# Patient Record
Sex: Female | Born: 1935 | Race: Black or African American | Hispanic: No | Smoking: Former smoker
Health system: Southern US, Community
[De-identification: ages and names within clinical notes are randomized; demographics above are authoritative.]

## PROBLEM LIST (undated history)

## (undated) DIAGNOSIS — R1310 Dysphagia, unspecified: Secondary | ICD-10-CM

## (undated) DIAGNOSIS — R221 Localized swelling, mass and lump, neck: Secondary | ICD-10-CM

## (undated) DIAGNOSIS — D649 Anemia, unspecified: Secondary | ICD-10-CM

## (undated) DIAGNOSIS — I639 Cerebral infarction, unspecified: Secondary | ICD-10-CM

## (undated) DIAGNOSIS — E079 Disorder of thyroid, unspecified: Secondary | ICD-10-CM

## (undated) DIAGNOSIS — E46 Unspecified protein-calorie malnutrition: Secondary | ICD-10-CM

## (undated) DIAGNOSIS — I10 Essential (primary) hypertension: Secondary | ICD-10-CM

## (undated) HISTORY — PX: TONSILLECTOMY: SUR1361

## (undated) HISTORY — PX: APPENDECTOMY: SHX54

## (undated) HISTORY — PX: OTHER SURGICAL HISTORY: SHX169

## (undated) HISTORY — PX: EYE SURGERY: SHX253

---

## 2000-07-14 ENCOUNTER — Encounter: Payer: Self-pay | Admitting: Emergency Medicine

## 2000-07-14 ENCOUNTER — Emergency Department (HOSPITAL_COMMUNITY): Admission: EM | Admit: 2000-07-14 | Discharge: 2000-07-14 | Payer: Self-pay | Admitting: Internal Medicine

## 2000-07-14 ENCOUNTER — Encounter: Payer: Self-pay | Admitting: Internal Medicine

## 2000-09-01 ENCOUNTER — Inpatient Hospital Stay (HOSPITAL_COMMUNITY): Admission: EM | Admit: 2000-09-01 | Discharge: 2000-09-05 | Payer: Self-pay | Admitting: Pediatrics

## 2000-09-01 ENCOUNTER — Emergency Department (HOSPITAL_COMMUNITY): Admission: EM | Admit: 2000-09-01 | Discharge: 2000-09-01 | Payer: Self-pay | Admitting: Emergency Medicine

## 2000-09-01 ENCOUNTER — Encounter: Payer: Self-pay | Admitting: Emergency Medicine

## 2000-09-02 ENCOUNTER — Encounter: Payer: Self-pay | Admitting: Pediatrics

## 2000-09-03 ENCOUNTER — Encounter: Payer: Self-pay | Admitting: Neurology

## 2000-09-05 ENCOUNTER — Inpatient Hospital Stay
Admission: RE | Admit: 2000-09-05 | Discharge: 2000-09-10 | Payer: Self-pay | Admitting: Physical Medicine & Rehabilitation

## 2000-12-24 ENCOUNTER — Ambulatory Visit (HOSPITAL_COMMUNITY)
Admission: RE | Admit: 2000-12-24 | Discharge: 2000-12-24 | Payer: Self-pay | Admitting: Physical Medicine & Rehabilitation

## 2004-03-28 ENCOUNTER — Emergency Department (HOSPITAL_COMMUNITY): Admission: EM | Admit: 2004-03-28 | Discharge: 2004-03-28 | Payer: Self-pay | Admitting: Emergency Medicine

## 2004-03-30 ENCOUNTER — Encounter: Admission: RE | Admit: 2004-03-30 | Discharge: 2004-03-30 | Payer: Self-pay | Admitting: Orthopedic Surgery

## 2004-03-31 ENCOUNTER — Ambulatory Visit (HOSPITAL_COMMUNITY): Admission: RE | Admit: 2004-03-31 | Discharge: 2004-03-31 | Payer: Self-pay | Admitting: Orthopedic Surgery

## 2004-03-31 ENCOUNTER — Ambulatory Visit (HOSPITAL_BASED_OUTPATIENT_CLINIC_OR_DEPARTMENT_OTHER): Admission: RE | Admit: 2004-03-31 | Discharge: 2004-03-31 | Payer: Self-pay | Admitting: Orthopedic Surgery

## 2005-01-16 ENCOUNTER — Observation Stay (HOSPITAL_COMMUNITY): Admission: EM | Admit: 2005-01-16 | Discharge: 2005-01-17 | Payer: Self-pay | Admitting: Emergency Medicine

## 2011-10-16 ENCOUNTER — Emergency Department (HOSPITAL_COMMUNITY): Payer: Medicare Other

## 2011-10-16 ENCOUNTER — Other Ambulatory Visit: Payer: Self-pay

## 2011-10-16 ENCOUNTER — Inpatient Hospital Stay (HOSPITAL_COMMUNITY): Payer: Medicare Other

## 2011-10-16 ENCOUNTER — Inpatient Hospital Stay (HOSPITAL_COMMUNITY)
Admission: EM | Admit: 2011-10-16 | Discharge: 2011-10-19 | DRG: 312 | Disposition: A | Discharge status: Home Health | Payer: Medicare Other | Source: Ambulatory Visit | Attending: Infectious Diseases | Admitting: Infectious Diseases

## 2011-10-16 ENCOUNTER — Encounter (HOSPITAL_COMMUNITY): Payer: Self-pay | Admitting: Emergency Medicine

## 2011-10-16 DIAGNOSIS — I951 Orthostatic hypotension: Principal | ICD-10-CM | POA: Diagnosis present

## 2011-10-16 DIAGNOSIS — R5381 Other malaise: Secondary | ICD-10-CM | POA: Diagnosis present

## 2011-10-16 DIAGNOSIS — R55 Syncope and collapse: Secondary | ICD-10-CM

## 2011-10-16 DIAGNOSIS — Y92009 Unspecified place in unspecified non-institutional (private) residence as the place of occurrence of the external cause: Secondary | ICD-10-CM

## 2011-10-16 DIAGNOSIS — Z8673 Personal history of transient ischemic attack (TIA), and cerebral infarction without residual deficits: Secondary | ICD-10-CM

## 2011-10-16 DIAGNOSIS — M949 Disorder of cartilage, unspecified: Secondary | ICD-10-CM | POA: Diagnosis present

## 2011-10-16 DIAGNOSIS — R1310 Dysphagia, unspecified: Secondary | ICD-10-CM | POA: Diagnosis present

## 2011-10-16 DIAGNOSIS — T502X5A Adverse effect of carbonic-anhydrase inhibitors, benzothiadiazides and other diuretics, initial encounter: Secondary | ICD-10-CM | POA: Diagnosis present

## 2011-10-16 DIAGNOSIS — M899 Disorder of bone, unspecified: Secondary | ICD-10-CM | POA: Diagnosis present

## 2011-10-16 DIAGNOSIS — R531 Weakness: Secondary | ICD-10-CM

## 2011-10-16 DIAGNOSIS — Z23 Encounter for immunization: Secondary | ICD-10-CM

## 2011-10-16 DIAGNOSIS — I10 Essential (primary) hypertension: Secondary | ICD-10-CM | POA: Diagnosis present

## 2011-10-16 DIAGNOSIS — E039 Hypothyroidism, unspecified: Secondary | ICD-10-CM | POA: Diagnosis present

## 2011-10-16 DIAGNOSIS — H409 Unspecified glaucoma: Secondary | ICD-10-CM | POA: Diagnosis present

## 2011-10-16 DIAGNOSIS — R410 Disorientation, unspecified: Secondary | ICD-10-CM

## 2011-10-16 DIAGNOSIS — Z79899 Other long term (current) drug therapy: Secondary | ICD-10-CM

## 2011-10-16 HISTORY — DX: Cerebral infarction, unspecified: I63.9

## 2011-10-16 HISTORY — DX: Essential (primary) hypertension: I10

## 2011-10-16 HISTORY — DX: Anemia, unspecified: D64.9

## 2011-10-16 LAB — LIPID PANEL
LDL Cholesterol: 93 mg/dL (ref 0–99)
Total CHOL/HDL Ratio: 3.2 RATIO
VLDL: 14 mg/dL (ref 0–40)

## 2011-10-16 LAB — DIFFERENTIAL
Basophils Absolute: 0 10*3/uL (ref 0.0–0.1)
Eosinophils Relative: 0 % (ref 0–5)
Monocytes Absolute: 0.4 10*3/uL (ref 0.1–1.0)
Neutrophils Relative %: 89 % — ABNORMAL HIGH (ref 43–77)

## 2011-10-16 LAB — IRON AND TIBC
Saturation Ratios: 4 % — ABNORMAL LOW (ref 20–55)
UIBC: 223 ug/dL (ref 125–400)

## 2011-10-16 LAB — URINALYSIS, ROUTINE W REFLEX MICROSCOPIC
Hgb urine dipstick: NEGATIVE
Leukocytes, UA: NEGATIVE
Protein, ur: 30 mg/dL — AB
Urobilinogen, UA: 1 mg/dL (ref 0.0–1.0)
pH: 7 (ref 5.0–8.0)

## 2011-10-16 LAB — CBC
HCT: 35.3 % — ABNORMAL LOW (ref 36.0–46.0)
MCH: 30.7 pg (ref 26.0–34.0)
MCV: 91.9 fL (ref 78.0–100.0)
RBC: 3.84 MIL/uL — ABNORMAL LOW (ref 3.87–5.11)
RDW: 13.6 % (ref 11.5–15.5)

## 2011-10-16 LAB — TROPONIN I: Troponin I: <0.3 ng/mL (ref <0.30)

## 2011-10-16 LAB — CARDIAC PANEL(CRET KIN+CKTOT+MB+TROPI)
CK, MB: 5.2 ng/mL — ABNORMAL HIGH (ref 0.3–4.0)
Troponin I: <0.3 ng/mL (ref <0.30)

## 2011-10-16 LAB — RETICULOCYTES
RBC.: 3.91 MIL/uL (ref 3.87–5.11)
Retic Count, Absolute: 27.4 10*3/uL (ref 19.0–186.0)
Retic Ct Pct: 0.7 % (ref 0.4–3.1)

## 2011-10-16 LAB — BASIC METABOLIC PANEL
Chloride: 114 mEq/L — ABNORMAL HIGH (ref 96–112)
Creatinine, Ser: 0.78 mg/dL (ref 0.50–1.10)
GFR calc non Af Amer: 73 mL/min — ABNORMAL LOW (ref >90)
Glucose, Bld: 107 mg/dL — ABNORMAL HIGH (ref 70–99)
Sodium: 144 mEq/L (ref 135–145)

## 2011-10-16 LAB — HEPATIC FUNCTION PANEL
ALT: 9 U/L (ref 0–35)
AST: 18 U/L (ref 0–37)
Alkaline Phosphatase: 52 U/L (ref 39–117)
Bilirubin, Direct: <0.1 mg/dL (ref 0.0–0.3)
Total Bilirubin: 0.5 mg/dL (ref 0.3–1.2)

## 2011-10-16 LAB — PHOSPHORUS: Phosphorus: 3.3 mg/dL (ref 2.3–4.6)

## 2011-10-16 LAB — FERRITIN: Ferritin: 201 ng/mL (ref 10–291)

## 2011-10-16 LAB — URINE MICROSCOPIC-ADD ON

## 2011-10-16 MED ORDER — SODIUM CHLORIDE 0.9 % IV SOLN
INTRAVENOUS | Status: DC
  Administered 2011-10-16: 17:00:00 via INTRAVENOUS

## 2011-10-16 MED ORDER — ACETAMINOPHEN 650 MG RE SUPP: 650.0000 mg | Freq: Four times a day (QID) | RECTAL | Status: DC | PRN

## 2011-10-16 MED ORDER — ONDANSETRON HCL 4 MG/2ML IJ SOLN: 4.0000 mg | Freq: Four times a day (QID) | INTRAMUSCULAR | Status: DC | PRN

## 2011-10-16 MED ORDER — ONDANSETRON HCL 4 MG PO TABS: 4.0000 mg | ORAL_TABLET | Freq: Four times a day (QID) | ORAL | Status: DC | PRN

## 2011-10-16 MED ORDER — DORZOLAMIDE HCL-TIMOLOL MAL 2-0.5 % OP SOLN
1.0000 [drp] | Freq: Two times a day (BID) | OPHTHALMIC | Status: DC
  Administered 2011-10-16 – 2011-10-19 (×6): 1 [drp] via OPHTHALMIC
  Filled 2011-10-16: qty 10

## 2011-10-16 MED ORDER — ENOXAPARIN SODIUM 40 MG/0.4ML ~~LOC~~ SOLN
40.0000 mg | SUBCUTANEOUS | Status: DC
  Administered 2011-10-16: 40 mg via SUBCUTANEOUS
  Filled 2011-10-16 (×2): qty 0.4

## 2011-10-16 MED ORDER — BRIMONIDINE TARTRATE 0.2 % OP SOLN
1.0000 [drp] | Freq: Two times a day (BID) | OPHTHALMIC | Status: DC
  Administered 2011-10-16 – 2011-10-19 (×6): 1 [drp] via OPHTHALMIC
  Filled 2011-10-16: qty 5

## 2011-10-16 MED ORDER — TRAVOPROST (BAK FREE) 0.004 % OP SOLN
1.0000 [drp] | Freq: Every day | OPHTHALMIC | Status: DC
  Administered 2011-10-16 – 2011-10-18 (×3): 1 [drp] via OPHTHALMIC
  Filled 2011-10-16: qty 2.5

## 2011-10-16 MED ORDER — SODIUM CHLORIDE 0.9 % IJ SOLN
3.0000 mL | Freq: Two times a day (BID) | INTRAMUSCULAR | Status: DC
  Administered 2011-10-18: 3 mL via INTRAVENOUS

## 2011-10-16 MED ORDER — POLYETHYL GLYCOL-PROPYL GLYCOL 0.4-0.3 % OP SOLN: 1.0000 [drp] | Freq: Two times a day (BID) | OPHTHALMIC | Status: DC

## 2011-10-16 MED ORDER — BRIMONIDINE TARTRATE 0.15 % OP SOLN
1.0000 [drp] | Freq: Two times a day (BID) | OPHTHALMIC | Status: DC
  Filled 2011-10-16: qty 5

## 2011-10-16 MED ORDER — ASPIRIN EC 81 MG PO TBEC
81.0000 mg | DELAYED_RELEASE_TABLET | Freq: Every day | ORAL | Status: DC
  Administered 2011-10-17 – 2011-10-19 (×3): 81 mg via ORAL
  Filled 2011-10-16 (×4): qty 1

## 2011-10-16 MED ORDER — SODIUM CHLORIDE 0.9 % IV SOLN: INTRAVENOUS | Status: DC

## 2011-10-16 MED ORDER — ACETAMINOPHEN 325 MG PO TABS
650.0000 mg | ORAL_TABLET | Freq: Four times a day (QID) | ORAL | Status: DC | PRN
  Administered 2011-10-18: 650 mg via ORAL
  Filled 2011-10-16: qty 2

## 2011-10-16 MED ORDER — INFLUENZA VIRUS VACC SPLIT PF IM SUSP
0.5000 mL | Freq: Once | INTRAMUSCULAR | Status: AC
  Administered 2011-10-16: 0.5 mL via INTRAMUSCULAR
  Filled 2011-10-16: qty 0.5

## 2011-10-16 MED ORDER — BRIMONIDINE TARTRATE 0.1 % OP SOLN: 1.0000 [drp] | Freq: Two times a day (BID) | OPHTHALMIC | Status: DC

## 2011-10-16 MED ORDER — POLYVINYL ALCOHOL 1.4 % OP SOLN
1.0000 [drp] | OPHTHALMIC | Status: DC | PRN
  Administered 2011-10-16: 1 [drp] via OPHTHALMIC
  Filled 2011-10-16: qty 15

## 2011-10-16 MED ORDER — SODIUM CHLORIDE 0.9 % IV SOLN
INTRAVENOUS | Status: DC
  Administered 2011-10-17 – 2011-10-18 (×2): via INTRAVENOUS

## 2011-10-16 NOTE — ED Provider Notes (Signed)
History     CSN: [id number]  Arrival date & time 10/16/11  0801   First MD Initiated Contact with Patient 10/16/11 [phone]      Chief Complaint  Patient presents with  . Loss of Consciousness    (Consider location/radiation/quality/duration/timing/severity/associated sxs/prior treatment) Patient is a 78 y.o. female presenting with fall. The history is provided by a relative.  Fall Incident onset: fall last night, unsure of how long ago. The fall occurred while walking (was walking to bathroom). Distance fallen: from standing. She landed on a hard floor. There was no blood loss. Point of impact: unclear, pt does not recall fall, c/o lower back pain. Pain location: low back. The pain is mild. She was not ambulatory at the scene. There was no entrapment after the fall. Pertinent negatives include no fever, no numbness, no abdominal pain, no nausea, no vomiting and no headaches. The symptoms are aggravated by activity. She has tried nothing for the symptoms.    Past Medical History  Diagnosis Date  . Glaucoma   . Stroke     No past surgical history on file.  Family History  Problem Relation Age of Onset  . Diabetes Mother   . Heart failure Mother   . Hypertension Mother   . Cancer Father     History  Substance Use Topics  . Smoking status: Not on file  . Smokeless tobacco: Not on file  . Alcohol Use: No    OB History    Grav Para Term Preterm Abortions TAB SAB Ect Mult Living                  Review of Systems  Constitutional: Negative for fever.  Respiratory: Negative for shortness of breath.   Cardiovascular: Negative for chest pain.  Gastrointestinal: Negative for nausea, vomiting and abdominal pain.  Neurological: Negative for dizziness, light-headedness, numbness and headaches.  All other systems reviewed and are negative.    Allergies  Penicillins  Home Medications   Current Outpatient Rx  Name Route Sig Dispense Refill  . ACETAZOLAMIDE ER 500 MG PO  CP12 Oral Take 500 mg by mouth daily after breakfast.    . BRIMONIDINE TARTRATE 0.1 % OP SOLN Left Eye Place 1 drop into the left eye every 12 (twelve) hours.    . DORZOLAMIDE HCL-TIMOLOL MAL 22.3-6.8 MG/ML OP SOLN Both Eyes Place 1 drop into both eyes 2 (two) times daily.    Marland Kitchen POLYETHYL GLYCOL-PROPYL GLYCOL 0.4-0.3 % OP SOLN Both Eyes Place 1 drop into both eyes 2 (two) times daily.    . TRAVOPROST (BAK FREE) 0.004 % OP SOLN Both Eyes Place 1 drop into both eyes at bedtime.      BP 190/82  Pulse 91  Temp(Src) 98.1 F (36.7 C) (Oral)  Resp 17  SpO2 99%  Physical Exam  Nursing note and vitals reviewed. Constitutional: She appears well-developed and well-nourished. No distress.       Elderly, thin   HENT:  Head: Normocephalic and atraumatic.  Mouth/Throat: Oropharynx is clear and moist.  Eyes: EOM are normal. Pupils are equal, round, and reactive to light.  Neck: Normal range of motion. Neck supple.       Soft, mobile non-tender mass over left cervical region just posterior to angle of mandible.   Cardiovascular: Normal rate and regular rhythm.  Exam reveals no friction rub.   Murmur (I/VI SEM) heard. Pulmonary/Chest: Effort normal and breath sounds normal. No respiratory distress. She has no wheezes. She has no  rales.  Abdominal: Soft. There is no tenderness. There is no rebound and no guarding.       Midline surgical incision, well healed, over lower abdomen  Musculoskeletal: Normal range of motion. She exhibits no edema and no tenderness.  Neurological: She is alert.       Oriented to person and place. Spontaneously moves all extremities. Symmetric strength in BUEs. Pt able to resist gravity with BLEs but testing against strength limited 2/2 chronic knee pain.   Skin: Skin is warm and dry. No rash noted.  Psychiatric: She has a normal mood and affect. Her behavior is normal.    ED Course  Procedures (including critical care time)  Results for orders placed during the hospital  encounter of 10/16/11  CBC      Component Value Range   WBC 8.5  4.0 - 10.5 (K/uL)   RBC 3.84 (*) 3.87 - 5.11 (MIL/uL)   Hemoglobin 11.8 (*) 12.0 - 15.0 (g/dL)   HCT 16.1 (*) 09.6 - 46.0 (%)   MCV 91.9  78.0 - 100.0 (fL)   MCH 30.7  26.0 - 34.0 (pg)   MCHC 33.4  30.0 - 36.0 (g/dL)   RDW 04.5  40.9 - 81.1 (%)   Platelets 301  150 - 400 (K/uL)  DIFFERENTIAL      Component Value Range   Neutrophils Relative 89 (*) 43 - 77 (%)   Lymphocytes Relative 6 (*) 12 - 46 (%)   Monocytes Relative 5  3 - 12 (%)   Eosinophils Relative 0  0 - 5 (%)   Basophils Relative 0  0 - 1 (%)   Neutro Abs 7.6  1.7 - 7.7 (K/uL)   Lymphs Abs 0.5 (*) 0.7 - 4.0 (K/uL)   Monocytes Absolute 0.4  0.1 - 1.0 (K/uL)   Eosinophils Absolute 0.0  0.0 - 0.7 (K/uL)   Basophils Absolute 0.0  0.0 - 0.1 (K/uL)  BASIC METABOLIC PANEL      Component Value Range   Sodium 144  135 - 145 (mEq/L)   Potassium 3.7  3.5 - 5.1 (mEq/L)   Chloride 114 (*) 96 - 112 (mEq/L)   CO2 19  19 - 32 (mEq/L)   Glucose, Bld 107 (*) 70 - 99 (mg/dL)   BUN 15  6 - 23 (mg/dL)   Creatinine, Ser 9.14  0.50 - 1.10 (mg/dL)   Calcium 78.2  8.4 - 10.5 (mg/dL)   GFR calc non Af Amer 73 (*) >90 (mL/min)   GFR calc Af Amer 85 (*) >90 (mL/min)  TROPONIN I      Component Value Range   Troponin I <0.30  <0.30 (ng/mL)  LACTIC ACID, PLASMA      Component Value Range   Lactic Acid, Venous 1.0  0.5 - 2.2 (mmol/L)  URINALYSIS, ROUTINE W REFLEX MICROSCOPIC      Component Value Range   Color, Urine YELLOW  YELLOW    APPearance CLEAR  CLEAR    Specific Gravity, Urine 1.018  1.005 - 1.030    pH 7.0  5.0 - 8.0    Glucose, UA NEGATIVE  NEGATIVE (mg/dL)   Hgb urine dipstick NEGATIVE  NEGATIVE    Bilirubin Urine NEGATIVE  NEGATIVE    Ketones, ur 15 (*) NEGATIVE (mg/dL)   Protein, ur 30 (*) NEGATIVE (mg/dL)   Urobilinogen, UA 1.0  0.0 - 1.0 (mg/dL)   Nitrite NEGATIVE  NEGATIVE    Leukocytes, UA NEGATIVE  NEGATIVE   CK  Component Value Range   Total CK  216 (*) 7 - 177 (U/L)  URINE MICROSCOPIC-ADD ON      Component Value Range   Squamous Epithelial / LPF RARE  RARE    WBC, UA 0-2  <3 (WBC/hpf)   RBC / HPF 0-2  <3 (RBC/hpf)   Bacteria, UA RARE  RARE    Casts HYALINE CASTS (*) NEGATIVE     Dg Chest 2 View  10/16/2011  *RADIOLOGY REPORT*  Clinical Data: Multiple falls.  Loss of consciousness.  Back pain.  CHEST - 2 VIEW  Comparison: 01/16/2005 and 03/30/2004.  Findings: Tortuous aorta with dilation of the proximal thoracic aorta.  Heart size within normal limits.  No infiltrate, congestive heart failure or pneumothorax.  Slight nodularity right heart border probably representing confluence of shadows.  Mild thoracic kyphosis.  Evaluation of thoracic spine limited by osteopenia.  If there is a question of thoracic spine injury, cone down views recommended.  IMPRESSION: Tortuous aorta.  Calcified dilated tortuous aorta.  Please see above.  Original Report Authenticated By: Fuller Canada, M.D.   Dg Lumbar Spine 2-3 Views  10/16/2011  *RADIOLOGY REPORT*  Clinical Data: Fall.  Pain.  LUMBAR SPINE - 2-3 VIEW  Comparison: None.  Findings: Scoliosis lumbar spine.  Mild loss of height lower thoracic vertebra and scattered mild Schmorl's node deformities without obvious compression fracture.  Evaluation is slightly limited by osteopenia.  Calcified aorta.  Caliber of the aorta not adequately assessed.  IMPRESSION: Scoliosis.  Osteopenia.  Degenerative changes.  Please see above.  Original Report Authenticated By: Fuller Canada, M.D.   Dg Pelvis 1-2 Views  10/16/2011  *RADIOLOGY REPORT*  Clinical Data: Fall.  Back pain and hip pain.  PELVIS - 1-2 VIEW  Comparison: None.  Findings: No fracture.  Evaluation of the sacrum limited by overlying stool.  If hip fracture is of high clinical concern, cone down views can be obtained for further delineation.  Bones osteopenic.  IMPRESSION: No fracture.  Please see above.  Original Report Authenticated By: Fuller Canada,  M.D.   Ct Head Wo Contrast  10/16/2011  *RADIOLOGY REPORT*  Clinical Data: 78 year old female with falls.  Pain.  Loss of consciousness.  CT HEAD WITHOUT CONTRAST  Technique:  Contiguous axial images were obtained from the base of the skull through the vertex without contrast.  Comparison: None.  Findings: No acute orbit or scalp soft tissue findings. Visualized paranasal sinuses and mastoids are clear.  No acute osseous abnormality identified.  Calcified atherosclerosis at the skull base.  Mild ventriculomegaly appears related to ex vacuo changes. No midline shift, mass effect, or evidence of mass lesion.  No acute intracranial hemorrhage identified.  Patchy confluent white matter hypodensity. No evidence of cortically based acute infarction identified.  No suspicious intracranial vascular hyperdensity.  IMPRESSION: No acute intracranial abnormality.  Volume loss and mild to moderate for age nonspecific white matter changes.  Original Report Authenticated By: Harley Hallmark, M.D.   Imaging independently Reviewed by me, interpreted by radiologist.  Date: 10/16/2011  Rate: 77  Rhythm: normal sinus rhythm  QRS Axis: normal  Intervals: normal  ST/T Wave abnormalities: nonspecific ST changes LVH  Conduction Disutrbances:none  Narrative Interpretation:   Old EKG Reviewed: sinus bradycardia   1. Syncope   2. Delirium       MDM  5:34 AM 78 year old female with history of hypertension, stroke and glaucoma presenting from home via EMS after being found on the floor by her  son this morning. He states she was apparently trying to walk to the bathroom when she fell for an unknown reason. She was down for an unknown amount of time. He states when he initially arrived at her apartment this morning she was saying strange things that did not make sense and appeared confused which is unusual for her. At baseline she is alert, oriented and conversant. He denies any recent fever, cough, chest pain, dyspnea,  abdominal pain, nausea, vomiting or diarrhea. The patient cannot recall why she fell. She appears well. She is hypertensive with systolics in the 170s, but vitals are otherwise stable. She is complaining of lower back pain currently. Her neurologic exam is nonfocal. At this point the cause of the fall is unclear. We'll check, CBC, BMP, troponin, urinalysis with culture, lactic acid, CT head, plain films of the chest, pelvis and lumbar spine.  11:55 AM labs are without significant abnormality. CT head negative. Plain film imaging negative for trauma. Mental status is improving but still altered. Given AMS/delerium medicine consulted for admission.       Sheran Luz, MD 10/16/11 1558

## 2011-10-16 NOTE — ED Notes (Signed)
Attempted to call report but RN is unavailable at present

## 2011-10-16 NOTE — ED Notes (Signed)
Internal Medicine is at the bedside. 

## 2011-10-16 NOTE — ED Notes (Signed)
Patient transported from CT 

## 2011-10-16 NOTE — ED Notes (Signed)
Family at bedside. 

## 2011-10-16 NOTE — ED Notes (Signed)
Pt still at X-ray 

## 2011-10-16 NOTE — H&P (Signed)
Hospital Admission Note Date: 10/16/2011  Patient name: Kathy Horton Medical record number: [id number] Date of birth: [birth year] Age: 78 y.o. Gender: female PCP: No primary provider on file.  Medical Service: Internal medicine teaching service  Attending physician:     1st Contact: Dr. Larey Seat  Pager: [phone] 2nd Contact: Dr. Allena Katz             Pager: [phone] After 5 pm or weekends: 1st Contact:      Pager: [phone] 2nd Contact:      Pager: [phone]  Chief Complaint: Fall and altered mental status  History of Present Illness:  78 year old woman with past medical history of stroke in 2002 and glaucoma was brought into the emergency department by her son this morning after he found her on the floor unable to get up. The patient has fluctuating mental status and is able to give some parts of the history but most of the information is obtained from the son . As per the son , the patient lives alone and is pretty functional at baseline. He checks on her every day. He went to see her this morning and that he knocked on the door the patient would not open the door but he could hear her speaking. She was asking him to come to the back door as she was unable to get up. The son got inside the house using a spare key and found the patient on the floor in between the bedroom and the living room. The patient was awake and communicative but had spells where she was not making any sense. She states she had a fall in the morning and was not able to get up. There is no history of syncope or seizures. The son did not notice any bowel or bladder incontinence when he found her. He is not sure how long the patient was lying on the floor and the patient does not remember how she got there. She wasn't complaining of any pain except for back and hip pain that she has been having for past few weeks after a fall she had at that time. She had her dinner the night before she went to bed but she has not been eating  too well in past few weeks. She was recently started on acetazolamide for glaucoma which is the only medication she takes in addition to her eyedrops. The family denies any other change in her social and mental status lately. She has not been seen by a primary care physician for more than 10 years. Since the patient has come to the ED, she has been drowsy but alert and oriented. She denies any complaints except for back pain when tried to move her and difficulty swallowing solids and liquids for past year or so.   Meds: Acetazolamide for glaucoma which was started 2 weeks ago Travatan, Systane and Cosopt eyedrops  Allergies: Penicillins Past Medical History  Diagnosis Date  . Glaucoma     recently started on acetazolamide   . Stroke 2002    2 strokes 1 month apart, has some speech deficit at basleine from this stroke and per the son patient has been slow  since then. Was taking aspirin before but stopped for past may years from unknown reasons.   Marland Kitchen HTN (hypertension)     not on any home medications   No past surgical history on file. Family History  Problem Relation Age of Onset  . Diabetes Mother   . Heart failure Mother   .  Hypertension Mother   . Cancer Father    History   Social History  . Marital Status: Divorced    Spouse Name: N/A    Number of Children: N/A  . Years of Education: N/A   Occupational History  . Not on file.   Social History Main Topics  . Smoking status: Not on file  . Smokeless tobacco: Not on file  . Alcohol Use: No  . Drug Use: No  . Sexually Active: No   Other Topics Concern  . Not on file   Social History Narrative   Lives alone. Has family members who check on her every day. Is pretty functional at baseline per the son. She cooks her own food and is able to perform her activities of daily living. She does not go out of the house because she could not drive and was using bus before but has stopped using it for a long time. Never smoked  cigarettes, never drank alcohol or any illicit drug use. Has [employer] Medicare.    Review of Systems: As per history of present illness  Physical Exam:  General Appearance:     Filed Vitals:   10/16/11 0915 10/16/11 0930 10/16/11 1051 10/16/11 1121  BP: 176/75 170/76 171/82 179/85  Pulse: 80 71 72 73  Temp:   97.8 F (36.6 C)   TempSrc:   Oral   Resp: 17 13 16 12   SpO2: 100% 100% 100% 100%     Alert and oriented to time place and person , occasionally confused, cooperative, no distress,   Head:    Normocephalic, without obvious abnormality, atraumatic  Eyes:    PERRL, conjunctiva/corneas clear, EOM's intact, fundi    benign, both eyes       Neck:   Supple, symmetrical,  nontender , nonfluctuant bulge on the left side of the neck,trachea midline, no adenopathy;       thyroid:  No enlargement/tenderness/nodules; no carotid   bruit or JVD  Lungs:     Clear to auscultation bilaterally, respirations unlabored  Chest wall:    No tenderness or deformity  Heart:    Regular rate and rhythm, S1 and S2 normal, no murmur, rub   or gallop  Abdomen:     Soft, non-tender, bowel sounds active all four quadrants,    no masses, no organomegaly  Extremities:   Extremities normal, atraumatic, no cyanosis or edema  Pulses:   2+ and symmetric all extremities  Skin:   Skin color, texture, turgor normal, no rashes or lesions  Neurologic:   oriented x3, Enulose to 12 intact with some speech slurring sensations intact bilaterally, motor strength symmetrically bilaterally, reflexes intact      Lab results: Basic Metabolic Panel:  Basename 10/16/11 0847  NA 144  K 3.7  CL 114*  CO2 19  GLUCOSE 107*  BUN 15  CREATININE 0.78  CALCIUM 10.1  MG --  PHOS --  CBC:  Basename 10/16/11 0847  WBC 8.5  NEUTROABS 7.6  HGB 11.8*  HCT 35.3*  MCV 91.9  PLT 301   Cardiac Enzymes:  Basename 10/16/11 0856 10/16/11 0847  CKTOTAL -- 216*  CKMB -- --  CKMBINDEX -- --  TROPONINI <0.30 --    Urinalysis:  Basename 10/16/11 0911  COLORURINE YELLOW  LABSPEC 1.018  PHURINE 7.0  GLUCOSEU NEGATIVE  HGBUR NEGATIVE  BILIRUBINUR NEGATIVE  KETONESUR 15*  PROTEINUR 30*  UROBILINOGEN 1.0  NITRITE NEGATIVE  LEUKOCYTESUR NEGATIVE   Imaging results:  Dg  Chest 2 View  10/16/2011  *RADIOLOGY REPORT*  Clinical Data: Multiple falls.  Loss of consciousness.  Back pain.  CHEST - 2 VIEW  Comparison: 01/16/2005 and 03/30/2004.  Findings: Tortuous aorta with dilation of the proximal thoracic aorta.  Heart size within normal limits.  No infiltrate, congestive heart failure or pneumothorax.  Slight nodularity right heart border probably representing confluence of shadows.  Mild thoracic kyphosis.  Evaluation of thoracic spine limited by osteopenia.  If there is a question of thoracic spine injury, cone down views recommended.  IMPRESSION: Tortuous aorta.  Calcified dilated tortuous aorta.  Please see above.  Original Report Authenticated By: Fuller Canada, M.D.   Dg Lumbar Spine 2-3 Views  10/16/2011  *RADIOLOGY REPORT*  Clinical Data: Fall.  Pain.  LUMBAR SPINE - 2-3 VIEW  Comparison: None.  Findings: Scoliosis lumbar spine.  Mild loss of height lower thoracic vertebra and scattered mild Schmorl's node deformities without obvious compression fracture.  Evaluation is slightly limited by osteopenia.  Calcified aorta.  Caliber of the aorta not adequately assessed.  IMPRESSION: Scoliosis.  Osteopenia.  Degenerative changes.  Please see above.  Original Report Authenticated By: Fuller Canada, M.D.   Dg Pelvis 1-2 Views  10/16/2011  *RADIOLOGY REPORT*  Clinical Data: Fall.  Back pain and hip pain.  PELVIS - 1-2 VIEW  Comparison: None.  Findings: No fracture.  Evaluation of the sacrum limited by overlying stool.  If hip fracture is of high clinical concern, cone down views can be obtained for further delineation.  Bones osteopenic.  IMPRESSION: No fracture.  Please see above.  Original Report Authenticated  By: Fuller Canada, M.D.   Ct Head Wo Contrast  10/16/2011  *RADIOLOGY REPORT*  Clinical Data: 78 year old female with falls.  Pain.  Loss of consciousness.  CT HEAD WITHOUT CONTRAST  Technique:  Contiguous axial images were obtained from the base of the skull through the vertex without contrast.  Comparison: None.  Findings: No acute orbit or scalp soft tissue findings. Visualized paranasal sinuses and mastoids are clear.  No acute osseous abnormality identified.  Calcified atherosclerosis at the skull base.  Mild ventriculomegaly appears related to ex vacuo changes. No midline shift, mass effect, or evidence of mass lesion.  No acute intracranial hemorrhage identified.  Patchy confluent white matter hypodensity. No evidence of cortically based acute infarction identified.  No suspicious intracranial vascular hyperdensity.  IMPRESSION: No acute intracranial abnormality.  Volume loss and mild to moderate for age nonspecific white matter changes.  Original Report Authenticated By: Harley Hallmark, M.D.    Other results: EKG: Poor quality EKG, repeat when patient gets up on the floor    Assessment & Plan by Problem:  78 year old woman with past medical history of stroke and glaucoma was brought to the emergency department after she had a fall this morning and was not able to get up. Is no definite history of syncope or seizures but she has fluctuating mental status and slowing worse than her baseline since the onset of symptoms.  #1 fall:  - Unsure if patient had a syncope with a fall - Noncontrast CT is negative and patient does not have any definite neurological deficits so the stroke as the cause of her symptoms is unlikely - She was recently started on acetazolamide tablets which is a side effect of ataxia and confusion which may be the cause of her symptoms - She is severely deconditioned and has back pain from previous fall limiting her mobility  -  She is uncontrolled hypertension and given her  age cardiovascular causes of syncope is another possibility -She has been having poor oral intake for past few months because of dysphagia and reduced appetite, so dehydration may be the cause of her fall  Plan - Admit to telemetry bed - Check orthostatic vitals - Follow serum chemistry, cardiac enzymes, TSH, HbA1c -Physical therapy evaluation and treatment -  Hold acetazolamide - Social work assistance for placement  #2 history of stroke - Patient not have any neurological deficits at this time - She has not taken aspirin for past 10 years - Restart low-dose aspirin and check lipid profile and A1c for secondary prevention  #3 Hypertension - Uncontrolled - Follow trends in the hospital. Start low-dose ACE inhibitor HCTZ combination if needed. - We need to ensure outpatient followup for this  #4 glaucoma  - Poor vision in the cause of her worsening dementia and falls  - Continue her eyedrops but poor acetazolamide for reasons mentioned above   #5 dysphasia - Patient complains of problem swallowing for past year or so - Start by getting a speech and swallow evaluation - May  need an evaluation in the hospital if this is causing malnutrition   #6 osteopenia - Seen on patient's x-rays done in the emergency room - Again if we can make sure that she will follow up with her PCP, she really to be treated for this prevent fractures  #7 placement Patient is severely deconditioned and unable to get out of the bed on my initial evaluation. She is also having progressive cognitive impairment and malnourishment She refused SNIF admission in the emergency department.She has good social support in follow for her son and daughter-in-law but they don't live with her . The daughter-in-law was mentioning that she would not move out of her current residence because she gets disability checks and she would not get them if she moves out . Last social worker to assist [place] and provide information to the  patient and the family about her options .    Milayna Rotenberg 10/16/2011, 12:21 PM

## 2011-10-16 NOTE — ED Notes (Signed)
Pt sleeping but easily aroused.  Pt with even and unlabored respirations.  When pt initially awoke, pt with some confusion.  Pt now alert and oriented x 4.  Pt.  Continues to deny pain.

## 2011-10-16 NOTE — ED Provider Notes (Signed)
I have personally seen and examined the patient.  I have discussed the plan of care with the resident.  I have reviewed the documentation on PMH/FH/Soc. History.  I have reviewed the documentation of the resident and agree.  I have reviewed and agree with the ECG interpretation(s) documented by the resident.  Pt stable at this time, still with confusion Will admit I discussed care with patient and family  Joya Gaskins, MD 10/16/11 [phone]

## 2011-10-16 NOTE — ED Notes (Signed)
Patient transported to X-ray 

## 2011-10-16 NOTE — ED Notes (Signed)
Assumed patient's car, pt alert, awake, answers questions appropriately, skin is warm and dry, respiration is even and unlabored.Pt denies any complaint, family is at the bedside.

## 2011-10-16 NOTE — Progress Notes (Addendum)
pt has hx of dysphagia; Speech has not stopped by to do swallow eval; Spoke with Dr.Wainright regarding pt being able to eat, told to keep pt NPO, no meds either; will continue to monitor

## 2011-10-16 NOTE — ED Notes (Signed)
Pt was able to swallow water without any problems but will not eat a cracker because she said that it gets stuck in her throat. Pt kept NPO

## 2011-10-16 NOTE — ED Notes (Signed)
Verbal report given to J. Friedburg, RN.

## 2011-10-17 ENCOUNTER — Inpatient Hospital Stay (HOSPITAL_COMMUNITY): Payer: Medicare Other

## 2011-10-17 LAB — URINE CULTURE
Colony Count: NO GROWTH
Culture: NO GROWTH

## 2011-10-17 LAB — CBC
HCT: 33.8 % — ABNORMAL LOW (ref 36.0–46.0)
MCH: 30.2 pg (ref 26.0–34.0)
MCHC: 33.1 g/dL (ref 30.0–36.0)
MCV: 91.1 fL (ref 78.0–100.0)
Platelets: 314 10*3/uL (ref 150–400)
RDW: 13.8 % (ref 11.5–15.5)

## 2011-10-17 LAB — CARDIAC PANEL(CRET KIN+CKTOT+MB+TROPI): Total CK: 155 U/L (ref 7–177)

## 2011-10-17 LAB — BASIC METABOLIC PANEL
BUN: 12 mg/dL (ref 6–23)
CO2: 11 mEq/L — ABNORMAL LOW (ref 19–32)
Calcium: 9.1 mg/dL (ref 8.4–10.5)
Chloride: 112 mEq/L (ref 96–112)
Creatinine, Ser: 0.76 mg/dL (ref 0.50–1.10)

## 2011-10-17 MED ORDER — POTASSIUM CHLORIDE 10 MEQ/100ML IV SOLN
10.0000 meq | INTRAVENOUS | Status: DC
Start: 2011-10-17 — End: 2011-10-17
  Filled 2011-10-17 (×4): qty 100

## 2011-10-17 MED ORDER — MORPHINE SULFATE 2 MG/ML IJ SOLN
0.5000 mg | INTRAMUSCULAR | Status: DC | PRN
  Administered 2011-10-17: 0.5 mg via INTRAVENOUS
  Administered 2011-10-17: 1 mg via INTRAVENOUS
  Administered 2011-10-17: 0.5 mg via INTRAVENOUS
  Filled 2011-10-17 (×3): qty 1

## 2011-10-17 MED ORDER — PANTOPRAZOLE SODIUM 40 MG PO TBEC
40.0000 mg | DELAYED_RELEASE_TABLET | Freq: Every day | ORAL | Status: DC
  Administered 2011-10-17 – 2011-10-19 (×3): 40 mg via ORAL
  Filled 2011-10-17 (×3): qty 1

## 2011-10-17 MED ORDER — POTASSIUM CHLORIDE 20 MEQ/15ML (10%) PO LIQD
40.0000 meq | Freq: Every day | ORAL | Status: DC
  Administered 2011-10-17: 40 meq via ORAL
  Filled 2011-10-17 (×3): qty 30

## 2011-10-17 MED ORDER — POTASSIUM CHLORIDE 10 MEQ/100ML IV SOLN
10.0000 meq | INTRAVENOUS | Status: DC
  Administered 2011-10-17: 10 meq via INTRAVENOUS
  Filled 2011-10-17 (×6): qty 100

## 2011-10-17 MED ORDER — HYDRALAZINE HCL 20 MG/ML IJ SOLN
5.0000 mg | Freq: Four times a day (QID) | INTRAMUSCULAR | Status: DC | PRN
  Administered 2011-10-17 (×2): 5 mg via INTRAVENOUS
  Filled 2011-10-17 (×2): qty 1

## 2011-10-17 MED ORDER — POTASSIUM CHLORIDE 20 MEQ/15ML (10%) PO LIQD
40.0000 meq | Freq: Every day | ORAL | Status: DC
  Administered 2011-10-17 – 2011-10-19 (×3): 40 meq via ORAL
  Filled 2011-10-17 (×4): qty 30

## 2011-10-17 MED ORDER — ENOXAPARIN SODIUM 30 MG/0.3ML ~~LOC~~ SOLN
30.0000 mg | SUBCUTANEOUS | Status: DC
  Administered 2011-10-17 – 2011-10-18 (×2): 30 mg via SUBCUTANEOUS
  Filled 2011-10-17 (×3): qty 0.3

## 2011-10-17 MED ORDER — ENSURE CLINICAL ST REVIGOR PO LIQD
237.0000 mL | Freq: Three times a day (TID) | ORAL | Status: DC
  Administered 2011-10-17 – 2011-10-19 (×3): 237 mL via ORAL

## 2011-10-17 NOTE — Evaluation (Signed)
Physical Therapy Evaluation Patient Details Name: Kathy Horton MRN: [id number] DOB: [birth year] Today's Date: 10/17/2011  Problem List: There is no problem list on file for this patient.   Past Medical History:  Past Medical History  Diagnosis Date  . Glaucoma     recently started on acetazolamide   . Stroke 2002    2 strokes 1 month apart, has some speech deficit at [place] from this stroke and per the son patient has been slow  since then. Was taking aspirin before but stopped for past may years from unknown reasons.   Marland Kitchen HTN (hypertension)     not on any home medications  . Anemia    Past Surgical History:  Past Surgical History  Procedure Date  . Right wrist     PT Assessment/Plan/Recommendation PT Assessment Clinical Impression Statement: Pt is 78 YO female s/p fall who presents with increased weakness and back pain.  She has decreased ability to mobilize and high fall risk.  Recommend 24 hr supervisoin at d/c which family reports that they can provide.  Also recommend HHPT, 3-in-1, and likely a RW. PT will follow acutely. PT Recommendation/Assessment: Patient will need skilled PT in the acute care venue PT Problem List: Decreased strength;Decreased activity tolerance;Decreased range of motion;Decreased balance;Decreased mobility;Decreased knowledge of use of DME;Decreased knowledge of precautions;Pain Barriers to Discharge: None PT Therapy Diagnosis : Difficulty walking;Generalized weakness;Acute pain PT Plan PT Frequency: Min 3X/week PT Treatment/Interventions: Gait training;DME instruction;Stair training;Functional mobility training;Therapeutic activities;Therapeutic exercise;Balance training;Patient/family education PT Recommendation Follow Up Recommendations: Home health PT;Supervision/Assistance - 24 hour Equipment Recommended: 3 in 1 bedside comode;Rolling walker with 5" wheels PT Goals  Acute Rehab PT Goals PT Goal Formulation: With patient/family Time For  Goal Achievement: 2 weeks Pt will go Supine/Side to Sit: with modified independence PT Goal: Supine/Side to Sit - Progress: Goal set today Pt will go Sit to Supine/Side: with modified independence PT Goal: Sit to Supine/Side - Progress: Goal set today Pt will go Sit to Stand: with supervision PT Goal: Sit to Stand - Progress: Goal set today Pt will go Stand to Sit: with supervision PT Goal: Stand to Sit - Progress: Goal set today Pt will Ambulate: 51 - 150 feet;with supervision;with least restrictive assistive device PT Goal: Ambulate - Progress: Goal set today Pt will Go Up / Down Stairs: 3-5 stairs;with min assist PT Goal: Up/Down Stairs - Progress: Goal set today Pt will Perform Home Exercise Program: with supervision, verbal cues required/provided PT Goal: Perform Home Exercise Program - Progress: Goal set today  PT Evaluation Precautions/Restrictions  Precautions Precautions: Fall Required Braces or Orthoses: No Restrictions Weight Bearing Restrictions: No Other Position/Activity Restrictions: HOH and visual deficits Prior Functioning  Home Living Lives With: Alone Receives Help From:  (daughter-in-law reports that pt can d/c home with them) Type of Home: Apartment (son's home is 1 level) Home Layout: One level Home Access: Elevator (son's home has 5 STE, no rail) Bathroom Shower/Tub: Forensic scientist: Standard Home Adaptive Equipment: Shower chair without back;Hand-held shower hose Prior Function Level of Independence: Independent with basic ADLs;Independent with gait;Independent with transfers;Independent with homemaking with ambulation Able to Take Stairs?: Yes Driving: No Vocation: Retired Comments: Pt reports that a few days before she fell she was able to ascend and descend the flight of stairs to her 2nd level apt (she does not like the elevator). Pt's family can give her 24 hr supervision/ min A at their home.  Daughter-in-law reports that pt  was independent but  slow PTA Cognition Cognition Arousal/Alertness: Awake/alert Overall Cognitive Status: Appears within functional limits for tasks assessed (slow response time) Orientation Level: Oriented X4 Cognition - Other Comments: Daughter in law reports pt at baseline. Sensation/Coordination Sensation Light Touch: Appears Intact Stereognosis: Appears Intact Hot/Cold: Not tested Proprioception: Appears Intact Coordination Gross Motor Movements are Fluid and Coordinated: No Fine Motor Movements are Fluid and Coordinated: Yes Coordination and Movement Description: pt moves very tentatively with great fear of falling Extremity Assessment RUE Assessment RUE Assessment: Within Functional Limits LUE Assessment LUE Assessment: Within Functional Limits RLE Assessment RLE Assessment: Exceptions to [facility] RLE Strength RLE Overall Strength: Deficits RLE Overall Strength Comments: grossly 2/5 throughout LLE Assessment LLE Assessment: Exceptions to [facility] LLE Strength LLE Overall Strength: Deficits LLE Overall Strength Comments: grossly 2/5 throughout Mobility (including Balance) Bed Mobility Bed Mobility: Yes Rolling Left: 5: Supervision;With rail Rolling Left Details (indicate cue type and reason): vc's to roll first as pt was not able to perform supine to sit independently, Once she rolled to left, she was able to sit up with only supervision and increased time Left Sidelying to Sit: 5: Supervision;With rails Sitting - Scoot to Edge of Bed: 6: Modified independent (Device/Increase time) Transfers Transfers: Yes Sit to Stand: 1: +2 Total assist;Patient percentage (comment);From bed (pt 75%, bilateral HHA) Sit to Stand Details (indicate cue type and reason): +2 given as pt very fearful of falling and thus, OOB mobility, bilateral HHA. Pt keeps bilateral knees in flexed position Stand to Sit: 1: +2 Total assist;To chair/3-in-1;With upper extremity assist;Patient percentage (comment) (pt  80%) Ambulation/Gait Ambulation/Gait: Yes Ambulation/Gait Assistance: 1: +2 Total assist;Patient percentage (comment) (pt 80%) Ambulation/Gait Assistance Details (indicate cue type and reason): bilateral HHA, pt stepped feet on her own, posterior lean noted Ambulation Distance (Feet): 4 Feet Assistive device: None Gait Pattern: Step-through pattern;Decreased stride length;Trunk flexed;Right flexed knee in stance;Left flexed knee in stance Gait velocity: very slow Stairs: No Wheelchair Mobility Wheelchair Mobility: No  Posture/Postural Control Posture/Postural Control: No significant limitations Balance Balance Assessed: Yes Dynamic Standing Balance Dynamic Standing - Balance Support: During functional activity;Bilateral upper extremity supported Dynamic Standing - Level of Assistance: 3: Mod assist Exercise  General Exercises - Lower Extremity Ankle Circles/Pumps: AROM;Both;10 reps;Seated Heel Slides: AROM;5 reps;Both;Seated Straight Leg Raises: AROM;Both;5 reps;Strengthening;Seated End of Session PT - End of Session Equipment Utilized During Treatment: Gait belt Activity Tolerance: Other (comment) (pt limited by weakness and fear) Patient left: in chair;with call bell in reach;with family/visitor present Nurse Communication: Mobility status for transfers;Mobility status for ambulation General Behavior During Session: [facility] for tasks performed Cognition: [facility] for tasks performed  Crew Goren, [place]  [phone] 10/17/2011, 1:18 PM

## 2011-10-17 NOTE — H&P (Signed)
Internal Medicine Teaching Service Attending Note Date: 10/17/2011  Patient name: Kathy Horton  Medical record number: [id number]  Date of birth: 1922/04/20   I have seen and evaluated Kathy Horton and discussed their care with the Residency Team.  I agree that Kathy Horton most likely has orhtostatic hypotension that has been exacerbated by recent addition of oral acetozolamide for Rx of glaucoma. Would d/c acetozolamide and oberve for next 24 hrs and recheck orthostatic BPs before condsidering d/c.  Physical Exam: Blood pressure 171/74, pulse 88, temperature 97.6 F (36.4 C), temperature source Oral, resp. rate 18, SpO2 100.00%. She is comfortable but mildly confused. General tenderness but none focal. No murmurs.   Lab results: Results for orders placed during the hospital encounter of 10/16/11 (from the past 24 hour(s))  CARDIAC PANEL(CRET KIN+CKTOT+MB+TROPI)     Status: Abnormal   Collection Time   10/16/11  4:18 PM      Component Value Range   Total CK 203 (*) 7 - 177 (U/L)   CK, MB 5.2 (*) 0.3 - 4.0 (ng/mL)   Troponin I <0.30  <0.30 (ng/mL)   Relative Index 2.6 (*) 0.0 - 2.5   MAGNESIUM     Status: Normal   Collection Time   10/16/11  4:19 PM      Component Value Range   Magnesium 2.1  1.5 - 2.5 (mg/dL)  PHOSPHORUS     Status: Normal   Collection Time   10/16/11  4:19 PM      Component Value Range   Phosphorus 3.3  2.3 - 4.6 (mg/dL)  HEPATIC FUNCTION PANEL     Status: Abnormal   Collection Time   10/16/11  4:19 PM      Component Value Range   Total Protein 8.2  6.0 - 8.3 (g/dL)   Albumin 3.4 (*) 3.5 - 5.2 (g/dL)   AST 18  0 - 37 (U/L)   ALT 9  0 - 35 (U/L)   Alkaline Phosphatase 52  39 - 117 (U/L)   Total Bilirubin 0.5  0.3 - 1.2 (mg/dL)   Bilirubin, Direct <8.2  0.0 - 0.3 (mg/dL)   Indirect Bilirubin NOT CALCULATED  0.3 - 0.9 (mg/dL)  TSH     Status: Abnormal   Collection Time   10/16/11  4:19 PM      Component Value Range   TSH 19.411 (*) 0.350 - 4.500  (uIU/mL)  VITAMIN B12     Status: Normal   Collection Time   10/16/11  4:19 PM      Component Value Range   Vitamin B-12 323  211 - 911 (pg/mL)  FOLATE     Status: Normal   Collection Time   10/16/11  4:19 PM      Component Value Range   Folate 13.8    IRON AND TIBC     Status: Abnormal   Collection Time   10/16/11  4:19 PM      Component Value Range   Iron 10 (*) 42 - 135 (ug/dL)   TIBC 956 (*) 213 - 086 (ug/dL)   Saturation Ratios 4 (*) 20 - 55 (%)   UIBC 223  125 - 400 (ug/dL)  FERRITIN     Status: Normal   Collection Time   10/16/11  4:19 PM      Component Value Range   Ferritin 201  10 - 291 (ng/mL)  RETICULOCYTES     Status: Normal   Collection Time   10/16/11  4:19  PM      Component Value Range   Retic Ct Pct 0.7  0.4 - 3.1 (%)   RBC. 3.91  3.87 - 5.11 (MIL/uL)   Retic Count, Manual 27.4  19.0 - 186.0 (K/uL)  HEMOGLOBIN A1C     Status: Abnormal   Collection Time   10/16/11  4:19 PM      Component Value Range   Hemoglobin A1C 5.9 (*) <5.7 (%)   Mean Plasma Glucose 123 (*) <117 (mg/dL)  LIPID PANEL     Status: Normal   Collection Time   10/16/11  4:30 PM      Component Value Range   Cholesterol 156  0 - 200 (mg/dL)   Triglycerides 71  <161 (mg/dL)   HDL 49  >09 (mg/dL)   Total CHOL/HDL Ratio 3.2     VLDL 14  0 - 40 (mg/dL)   LDL Cholesterol 93  0 - 99 (mg/dL)  CARDIAC PANEL(CRET KIN+CKTOT+MB+TROPI)     Status: Normal   Collection Time   10/16/11 11:40 PM      Component Value Range   Total CK 155  7 - 177 (U/L)   CK, MB 3.9  0.3 - 4.0 (ng/mL)   Troponin I <0.30  <0.30 (ng/mL)   Relative Index 2.5  0.0 - 2.5   BASIC METABOLIC PANEL     Status: Abnormal   Collection Time   10/17/11  8:33 AM      Component Value Range   Sodium 141  135 - 145 (mEq/L)   Potassium 2.7 (*) 3.5 - 5.1 (mEq/L)   Chloride 112  96 - 112 (mEq/L)   CO2 11 (*) 19 - 32 (mEq/L)   Glucose, Bld 76  70 - 99 (mg/dL)   BUN 12  6 - 23 (mg/dL)   Creatinine, Ser 6.04  0.50 - 1.10 (mg/dL)   Calcium 9.1  8.4  - 10.5 (mg/dL)   GFR calc non Af Amer 74 (*) >90 (mL/min)   GFR calc Af Amer 85 (*) >90 (mL/min)  CBC     Status: Abnormal   Collection Time   10/17/11  8:33 AM      Component Value Range   WBC 9.9  4.0 - 10.5 (K/uL)   RBC 3.71 (*) 3.87 - 5.11 (MIL/uL)   Hemoglobin 11.2 (*) 12.0 - 15.0 (g/dL)   HCT 54.0 (*) 98.1 - 46.0 (%)   MCV 91.1  78.0 - 100.0 (fL)   MCH 30.2  26.0 - 34.0 (pg)   MCHC 33.1  30.0 - 36.0 (g/dL)   RDW 19.1  47.8 - 29.5 (%)   Platelets 314  150 - 400 (K/uL)  CARDIAC PANEL(CRET KIN+CKTOT+MB+TROPI)     Status: Abnormal   Collection Time   10/17/11  8:33 AM      Component Value Range   Total CK 168  7 - 177 (U/L)   CK, MB 5.4 (*) 0.3 - 4.0 (ng/mL)   Troponin I <0.30  <0.30 (ng/mL)   Relative Index 3.2 (*) 0.0 - 2.5     Imaging results:  Dg Chest 2 View  10/16/2011  *RADIOLOGY REPORT*  Clinical Data: Multiple falls.  Loss of consciousness.  Back pain.  CHEST - 2 VIEW  Comparison: 01/16/2005 and 03/30/2004.  Findings: Tortuous aorta with dilation of the proximal thoracic aorta.  Heart size within normal limits.  No infiltrate, congestive heart failure or pneumothorax.  Slight nodularity right heart border probably representing confluence of shadows.  Mild thoracic kyphosis.  Evaluation of thoracic spine limited by osteopenia.  If there is a question of thoracic spine injury, cone down views recommended.  IMPRESSION: Tortuous aorta.  Calcified dilated tortuous aorta.  Please see above.  Original Report Authenticated By: Fuller Canada, M.D.   Dg Lumbar Spine 2-3 Views  10/16/2011  *RADIOLOGY REPORT*  Clinical Data: Fall.  Pain.  LUMBAR SPINE - 2-3 VIEW  Comparison: None.  Findings: Scoliosis lumbar spine.  Mild loss of height lower thoracic vertebra and scattered mild Schmorl's node deformities without obvious compression fracture.  Evaluation is slightly limited by osteopenia.  Calcified aorta.  Caliber of the aorta not adequately assessed.  IMPRESSION: Scoliosis.  Osteopenia.   Degenerative changes.  Please see above.  Original Report Authenticated By: Fuller Canada, M.D.   Dg Pelvis 1-2 Views  10/16/2011  *RADIOLOGY REPORT*  Clinical Data: Fall.  Back pain and hip pain.  PELVIS - 1-2 VIEW  Comparison: None.  Findings: No fracture.  Evaluation of the sacrum limited by overlying stool.  If hip fracture is of high clinical concern, cone down views can be obtained for further delineation.  Bones osteopenic.  IMPRESSION: No fracture.  Please see above.  Original Report Authenticated By: Fuller Canada, M.D.   Dg Hip Bilateral Vito Berger  10/16/2011  *RADIOLOGY REPORT*  Clinical Data: Bilateral hip pain, fall  BILATERAL HIP WITH PELVIS - 4+ VIEW  Comparison: 10/16/2011 pelvic radiograph  Findings: Osseous demineralization. Bilateral narrowing of the hip joints. SI joints preserved. No acute fracture, dislocation, or bone destruction. Scattered atherosclerotic calcifications.  IMPRESSION: Osseous demineralization. Mild degenerative changes of the hip joints bilaterally. No acute bony abnormalities.  Original Report Authenticated By: Lollie Marrow, M.D.   Ct Head Wo Contrast  10/16/2011  *RADIOLOGY REPORT*  Clinical Data: 78 year old female with falls.  Pain.  Loss of consciousness.  CT HEAD WITHOUT CONTRAST  Technique:  Contiguous axial images were obtained from the base of the skull through the vertex without contrast.  Comparison: None.  Findings: No acute orbit or scalp soft tissue findings. Visualized paranasal sinuses and mastoids are clear.  No acute osseous abnormality identified.  Calcified atherosclerosis at the skull base.  Mild ventriculomegaly appears related to ex vacuo changes. No midline shift, mass effect, or evidence of mass lesion.  No acute intracranial hemorrhage identified.  Patchy confluent white matter hypodensity. No evidence of cortically based acute infarction identified.  No suspicious intracranial vascular hyperdensity.  IMPRESSION: No acute intracranial  abnormality.  Volume loss and mild to moderate for age nonspecific white matter changes.  Original Report Authenticated By: Harley Hallmark, M.D.    Assessment and Plan: I agree with the formulated Assessment and Plan with the following changes:Suspect that fall was secondary to orthostatic hypotension and will monitor. Discuss with family placement temporary or longer term. Family by history is very supportive.

## 2011-10-17 NOTE — Evaluation (Signed)
Clinical/Bedside Swallow Evaluation Patient Details  Name: Kathy Horton MRN: [id number] DOB: 15-Apr-1922 Today's Date: 10/17/2011  Past Medical History:  Past Medical History  Diagnosis Date  . Glaucoma     recently started on acetazolamide   . Stroke 2002    2 strokes 1 month apart, has some speech deficit at [place] from this stroke and per the son patient has been slow  since then. Was taking aspirin before but stopped for past may years from unknown reasons.   Marland Kitchen HTN (hypertension)     not on any home medications  . Anemia    Past Surgical History:  Past Surgical History  Procedure Date  . Right wrist    HPI:  78 year old woman with past medical history of stroke in 2002 and glaucoma was brought into the emergency department by her son on 3/5 after he found her on the floor unable to get up.  Patient has had a poor appetite over the last couple of weeks and her fall may have been related to malnurtition.  Patient reports a globus sensation with solids as well as coughing at meals.   Assessment/Recommendations/Treatment Plan Suspected Esophageal Findings Suspected Esophageal Findings: Belching;Globus sensation  SLP Assessment Clinical Impression Statement: Demonstrates a suspected structural dysphagia with differential diagnosis of esophageal phase based vs. a pharyngeal phase component (osteophytes, tight UES, etc.).  Given patient's decreased PO intake PTA and a h/o CVA, will proceed with an objective swallow assessment to determine nature of problem and assess compensatory strategies for the safest nutritional intake. Risk for Aspiration: None Other Related Risk Factors: Previous CVA  Swallow Evaluation Recommendations Recommended Consults: MBS General Recommendation: NPO Oral Care Recommendations: Oral care QID  Treatment Plan Treatment Plan Recommendations: Defer treatment plan to SLP at (Comment) (MBSS today at 1030)   Myra Rude, M.S.,CCC-SLP Pager [phone]  10/17/2011,9:34 AM

## 2011-10-17 NOTE — Progress Notes (Signed)
Clinical Social Worker completed the psychosocial assessment, which can be found in the shadow chart. Discharge plan is home with 24 care and supervision by patient dau-in-law, Leonie Douglas, and relief provided by her husband, patient's son Gita Kudo.  Genelle Bal, MSW, LCSW [phone]

## 2011-10-17 NOTE — Procedures (Signed)
Modified Barium Swallow Procedure Note Patient Details  Name: Kathy Horton MRN: [id number] Date of Birth: [birth year]  Today's Date: 10/17/2011 Time:  -     Past Medical History:  Past Medical History  Diagnosis Date  . Glaucoma     recently started on acetazolamide   . Stroke 2002    2 strokes 1 month apart, has some speech deficit at [place] from this stroke and per the son patient has been slow  since then. Was taking aspirin before but stopped for past may years from unknown reasons.   Marland Kitchen HTN (hypertension)     not on any home medications  . Anemia    Past Surgical History:  Past Surgical History  Procedure Date  . Right wrist    HPI:  78 year old woman with past medical history of stroke in 2002 and glaucoma was brought into the emergency department by her son on 3/5 after he found her on the floor unable to get up.  Patient has had a poor appetite over the last couple of weeks and her fall may have been related to malnurtition.  Patient reports a globus sensation with solids as well as coughing at meals.     Recommendation/Prognosis  Clinical Impression Dysphagia Diagnosis: Within Functional Limits;Suspected primary esophageal dysphagia Clinical impression: Demonstrates a functional oral-pharyngeal swallow without any overt incidents of laryngeal penetration or aspiration.  No structural pharyngeal dysphagia noted despite patient's complaints of globus sensation at the level of the larynx.  Esophageal sweep did reveal what appeared to be mid-distal esohpageal stasis of solid bolus trials as well as tertiary contractions.  Patient's symptom report of chest tightness and "the food is stuck" coincides with these esophageal clinical signs, indicating patient's swallowing difficulties are likely more related to an esophageal dysphagia.  Recommended compensatory feeding behaviors to reduced symptoms.  Swallow Evaluation Recommendations Solid Consistency: Dysphagia 3 (Mechanical  soft) (per patient request) Liquid Consistency: Thin Liquid Administration via: Cup;Straw Medication Administration: Whole meds with puree Supervision: Patient able to self feed Compensations: Slow rate;Small sips/bites;Follow solids with liquid Postural Changes and/or Swallow Maneuvers: Seated upright 90 degrees;Upright 30-60 min after meal Oral Care Recommendations: Oral care QID Follow up Recommendations: None   SLP Assessment/Plan Clinical Impression Statement: Demonstrates a suspected structural dysphagia with differential diagnosis of esophageal phase based vs. a pharyngeal phase component (osteophytes, tight UES, etc.).  Given patient's decreased PO intake PTA and a h/o CVA, will proceed with an objective swallow assessment to determine nature of problem and assess compensatory strategies for the safest nutritional intake. Speech Therapy Frequency: min 1 x/week  SLP Goals  SLP Swallowing Goals Patient will consume recommended diet without observed clinical signs of aspiration with: Modified independent assistance Patient will utilize recommended strategies during swallow to increase swallowing safety with: Modified independent assistance  Oral Phase Oral Preparation/Oral Phase Oral Phase: WFL Pharyngeal Phase  Pharyngeal Phase Pharyngeal Phase: Within functional limits Cervical Esophageal Phase  Cervical Esophageal Phase Cervical Esophageal Phase: Telford Nab, M.S.,CCC-SLP Pager [phone] 10/17/2011, 12:03 PM

## 2011-10-17 NOTE — Progress Notes (Signed)
Critical potassium 2.7 received. Dr. Allena Katz made aware. New orders received. Duwaine Maxin, Rn

## 2011-10-17 NOTE — Progress Notes (Signed)
UR completed. Kathy Horton 10/17/2011 [phone] 

## 2011-10-17 NOTE — Progress Notes (Signed)
Helped pt from bed to [facility]. Once sitting on BSC, pt became very dizzy and stated that her vision was very blurry/spinning. Automatic BP read 200/99. Pt helped back to bed and BP taken again: 172/82. After a few minutes, patient felt back to normal. MD made aware. Duwaine Maxin, RN

## 2011-10-17 NOTE — Progress Notes (Signed)
Subjective: Patient is feeling better this morning.  Pain is improved after getting morphine.  Per physical therapy orthostatic vitals show patient is not orthostatic.   Objective: Vital signs in last 24 hours: Filed Vitals:   10/17/11 0557 10/17/11 0605 10/17/11 0700 10/17/11 0800  BP: 190/82  178/80 171/74  Pulse: 80   88  Temp:  97.6 F (36.4 C)    TempSrc:      Resp: 18   18  SpO2: 100% 95%  100%   Weight change:   Intake/Output Summary (Last 24 hours) at 10/17/11 1054 Last data filed at 10/17/11 0855  Gross per 24 hour  Intake      0 ml  Output      0 ml  Net      0 ml   Physical Exam: Alert and oriented x3, cooperative, no distress,  Head: Normocephalic, without obvious abnormality, atraumatic Eyes: PERRL, conjunctiva/corneas clear, EOM's intact Neck: no carotid bruit or JVD  Lungs: Clear to auscultation bilaterally, respirations unlabored Chest wall: No tenderness or deformity Heart: Regular rate and rhythm, S1 and S2 normal, no murmur, rub or gallop  Upper Body: No tenderness to palpation over shoulders or chest. Abdomen: Soft, non-tender, bowel sounds active all four quadrants, no masses, no organomegaly  Extremities: Extremities normal, atraumatic, no cyanosis or edema  Pulses: 2+ and symmetric all extremities  Skin: Skin color, texture, turgor normal, no rashes or lesions  Neurologic: oriented x3, CN 2-12 intact.  Speech is improved today, somnalence resolved.    Lab Results: Basic Metabolic Panel:  Lab 10/17/11 1610 10/16/11 1619 10/16/11 0847  NA 141 -- 144  K 2.7* -- 3.7  CL 112 -- 114*  CO2 11* -- 19  GLUCOSE 76 -- 107*  BUN 12 -- 15  CREATININE 0.76 -- 0.78  CALCIUM 9.1 -- 10.1  MG -- 2.1 --  PHOS -- 3.3 --   Liver Function Tests:  Lab 10/16/11 1619  AST 18  ALT 9  ALKPHOS 52  BILITOT 0.5  PROT 8.2  ALBUMIN 3.4*   CBC:  Lab 10/17/11 0833 10/16/11 0847  WBC 9.9 8.5  NEUTROABS -- 7.6  HGB 11.2* 11.8*  HCT 33.8* 35.3*  MCV 91.1  91.9  PLT 314 301   Cardiac Enzymes:  Lab 10/17/11 0833 10/16/11 2340 10/16/11 1618  CKTOTAL 168 155 203*  CKMB 5.4* 3.9 5.2*  CKMBINDEX -- -- --  TROPONINI <0.30 <0.30 <0.30   Hemoglobin A1C:  Lab 10/16/11 1619  HGBA1C 5.9*   Fasting Lipid Panel:  Lab 10/16/11 1630  CHOL 156  HDL 49  LDLCALC 93  TRIG 71  CHOLHDL 3.2  LDLDIRECT --   Thyroid Function Tests:  Lab 10/16/11 1619  TSH 19.411*  T4TOTAL --  FREET4 --  T3FREE --  THYROIDAB --   Anemia Panel:  Lab 10/16/11 1619  VITAMINB12 323  FOLATE 13.8  FERRITIN 201  TIBC 233*  IRON 10*  RETICCTPCT 0.7   Urinalysis:  Lab 10/16/11 0911  COLORURINE YELLOW  LABSPEC 1.018  PHURINE 7.0  GLUCOSEU NEGATIVE  HGBUR NEGATIVE  BILIRUBINUR NEGATIVE  KETONESUR 15*  PROTEINUR 30*  UROBILINOGEN 1.0  NITRITE NEGATIVE  LEUKOCYTESUR NEGATIVE    Studies/Results: Dg Chest 2 View  10/16/2011  *RADIOLOGY REPORT*  Clinical Data: Multiple falls.  Loss of consciousness.  Back pain.  CHEST - 2 VIEW  Comparison: 01/16/2005 and 03/30/2004.  Findings: Tortuous aorta with dilation of the proximal thoracic aorta.  Heart size within normal limits.  No infiltrate, congestive heart failure or pneumothorax.  Slight nodularity right heart border probably representing confluence of shadows.  Mild thoracic kyphosis.  Evaluation of thoracic spine limited by osteopenia.  If there is a question of thoracic spine injury, cone down views recommended.  IMPRESSION: Tortuous aorta.  Calcified dilated tortuous aorta.  Please see above.  Original Report Authenticated By: Fuller Canada, M.D.   Dg Lumbar Spine 2-3 Views  10/16/2011  *RADIOLOGY REPORT*  Clinical Data: Fall.  Pain.  LUMBAR SPINE - 2-3 VIEW  Comparison: None.  Findings: Scoliosis lumbar spine.  Mild loss of height lower thoracic vertebra and scattered mild Schmorl's node deformities without obvious compression fracture.  Evaluation is slightly limited by osteopenia.  Calcified aorta.   Caliber of the aorta not adequately assessed.  IMPRESSION: Scoliosis.  Osteopenia.  Degenerative changes.  Please see above.  Original Report Authenticated By: Fuller Canada, M.D.   Dg Pelvis 1-2 Views  10/16/2011  *RADIOLOGY REPORT*  Clinical Data: Fall.  Back pain and hip pain.  PELVIS - 1-2 VIEW  Comparison: None.  Findings: No fracture.  Evaluation of the sacrum limited by overlying stool.  If hip fracture is of high clinical concern, cone down views can be obtained for further delineation.  Bones osteopenic.  IMPRESSION: No fracture.  Please see above.  Original Report Authenticated By: Fuller Canada, M.D.   Dg Hip Bilateral Vito Berger  10/16/2011  *RADIOLOGY REPORT*  Clinical Data: Bilateral hip pain, fall  BILATERAL HIP WITH PELVIS - 4+ VIEW  Comparison: 10/16/2011 pelvic radiograph  Findings: Osseous demineralization. Bilateral narrowing of the hip joints. SI joints preserved. No acute fracture, dislocation, or bone destruction. Scattered atherosclerotic calcifications.  IMPRESSION: Osseous demineralization. Mild degenerative changes of the hip joints bilaterally. No acute bony abnormalities.  Original Report Authenticated By: Lollie Marrow, M.D.   Ct Head Wo Contrast  10/16/2011  *RADIOLOGY REPORT*  Clinical Data: 78 year old female with falls.  Pain.  Loss of consciousness.  CT HEAD WITHOUT CONTRAST  Technique:  Contiguous axial images were obtained from the base of the skull through the vertex without contrast.  Comparison: None.  Findings: No acute orbit or scalp soft tissue findings. Visualized paranasal sinuses and mastoids are clear.  No acute osseous abnormality identified.  Calcified atherosclerosis at the skull base.  Mild ventriculomegaly appears related to ex vacuo changes. No midline shift, mass effect, or evidence of mass lesion.  No acute intracranial hemorrhage identified.  Patchy confluent white matter hypodensity. No evidence of cortically based acute infarction identified.  No  suspicious intracranial vascular hyperdensity.  IMPRESSION: No acute intracranial abnormality.  Volume loss and mild to moderate for age nonspecific white matter changes.  Original Report Authenticated By: Harley Hallmark, M.D.   Medications: I have reviewed the patient's current medications. Scheduled Meds:   . aspirin EC  81 mg Oral Daily  . brimonidine  1 drop Left Eye BID  . dorzolamide-timolol  1 drop Both Eyes BID  . enoxaparin  40 mg Subcutaneous Q24H  . influenza  inactive virus vaccine  0.5 mL Intramuscular Once  . potassium chloride  10 mEq Intravenous Q1 Hr x 6   Followed by  . potassium chloride  10 mEq Intravenous Q1 Hr x 4  . sodium chloride  3 mL Intravenous Q12H  . Travoprost (BAK Free)  1 drop Both Eyes QHS  . DISCONTD: brimonidine  1 drop Left Eye Q12H  . DISCONTD: brimonidine  1 drop Left Eye Q12H  .  DISCONTD: Polyethyl Glycol-Propyl Glycol  1 drop Both Eyes BID   Continuous Infusions:   . sodium chloride 100 mL/hr (10/17/11 0222)  . DISCONTD: sodium chloride    . DISCONTD: sodium chloride 200 mL/hr at 10/16/11 1655   PRN Meds:.acetaminophen, acetaminophen, hydrALAZINE, morphine injection, ondansetron (ZOFRAN) IV, ondansetron, polyvinyl alcohol   Assessment/Plan:   78 year old woman with past medical history of stroke and glaucoma was brought to the emergency department after she had a fall this morning and was not able to get up. Is no definite history of syncope or seizures but she has fluctuating mental status and slowing worse than her baseline since the onset of symptoms.   fall/AMS: Unclear the circumstances.  Her mental status is improving but not quite back to baseline per family.  CT head negative.  Orthostatic hypotension has resolved, unsure of reliability of BP measurements yesterday?  No definite neuro deficits.  This could possibly all be due to acetazolamide therapy for her glaucoma?  Decreased PO intake from living alone and dysphagia could also be  contributing.  Pelvis/hip XRays negative for fracture.  -Continue IVF at 100 cc/hr -To live with daughter on discharge and get 24 care.  HHN and HHPT -PT/OT in house -fu free T4 per below  history of stroke  - Patient does not have any neurological deficits at this time  - Family believes she is still taking aspirin at home.  Will make sure of this on discharge.  High TSH: 19.411.  Will check free T4.  Hypertension  - BPs very high here, but was orthostatic yesterday.  No longer orthostatic today per vitals taken by PT.  Will hold off on scheduled BP medicine for another day.   - Start low-dose ACE inhibitor HCTZ combination if needed tomorrow.  - We need to ensure outpatient followup for this   glaucoma  - Poor vision in the cause of her worsening dementia and falls  - Continue her eyedrops but discontinued acetazolamide for reasons mentioned above   dysphasia  - Patient complains of problem swallowing for past year or so  - Evaluated by swallow study by speech therapy today.  They did not see a structural problem.  Felt it was likely a motility problem, possibly related to GERD, but they explained that this was outside their scope of practice.   -Started PPI.  PCP follow-up should evaluate efficacy and consider GI referral for outpatient workup if she continues to have trouble.  osteopenia  - Seen on patient's x-rays done in the emergency room  - Again if we can make sure that she will follow up with her PCP, she really should be treated for this prevent fractures.  Will hold off bisphosphonate for now given dysphagia.  dispo Patient and family wish for her to go live with daughter-in-law and son so she can have 24 hr care with Home health helping out.  Home health PT as well.   LOS: 1 day   Blanca Friend 10/17/2011, 10:54 AM

## 2011-10-17 NOTE — Progress Notes (Signed)
INITIAL ADULT NUTRITION ASSESSMENT Date: 10/17/2011   Time: 3:06 PM  Reason for Assessment: Nutrition Risk Report  ASSESSMENT: Female 78 y.o.  Dx: s/p fall, dysphagia, HTN  Hx:  Past Medical History  Diagnosis Date  . Glaucoma     recently started on acetazolamide   . Stroke 2002    2 strokes 1 month apart, has some speech deficit at [place] from this stroke and per the son patient has been slow  since then. Was taking aspirin before but stopped for past may years from unknown reasons.   Marland Kitchen HTN (hypertension)     not on any home medications  . Anemia     Related Meds:     . aspirin EC  81 mg Oral Daily  . brimonidine  1 drop Left Eye BID  . dorzolamide-timolol  1 drop Both Eyes BID  . enoxaparin  30 mg Subcutaneous Q24H  . influenza  inactive virus vaccine  0.5 mL Intramuscular Once  . pantoprazole  40 mg Oral Q1200  . potassium chloride  40 mEq Oral Daily  . potassium chloride  40 mEq Oral Daily  . sodium chloride  3 mL Intravenous Q12H  . Travoprost (BAK Free)  1 drop Both Eyes QHS  . DISCONTD: brimonidine  1 drop Left Eye Q12H  . DISCONTD: brimonidine  1 drop Left Eye Q12H  . DISCONTD: enoxaparin  40 mg Subcutaneous Q24H  . DISCONTD: Polyethyl Glycol-Propyl Glycol  1 drop Both Eyes BID  . DISCONTD: potassium chloride  10 mEq Intravenous Q1 Hr x 6  . DISCONTD: potassium chloride  10 mEq Intravenous Q1 Hr x 4    Ht: 5\' 4"  (162.6 cm)  Wt: 96 lb (43.545 kg)  Ideal Wt: 54.5 kg  % Ideal Wt: 80%  Usual Wt: 120 lb % Usual Wt: 80%  Body mass index is 16.48 kg/(m^2).  Food/Nutrition Related Hx: unintentional weight loss > 10 lbs within the past month per admission nutrition screen  Labs:  CMP     Component Value Date/Time   NA 141 10/17/2011 0833   K 2.7* 10/17/2011 0833   CL 112 10/17/2011 0833   CO2 11* 10/17/2011 0833   GLUCOSE 76 10/17/2011 0833   BUN 12 10/17/2011 0833   CREATININE 0.76 10/17/2011 0833   CALCIUM 9.1 10/17/2011 0833   PROT 8.2 10/16/2011 1619   ALBUMIN  3.4* 10/16/2011 1619   AST 18 10/16/2011 1619   ALT 9 10/16/2011 1619   ALKPHOS 52 10/16/2011 1619   BILITOT 0.5 10/16/2011 1619   GFRNONAA 74* 10/17/2011 0833   GFRAA 85* 10/17/2011 0833      Total I/O In: 120 [P.O.:120] Out: -   Diet Order: Dysphagia 3, thin liquids  Supplements/Tube Feeding: N/A  IVF:    sodium chloride Last Rate: 100 mL/hr at 10/17/11 1407  DISCONTD: sodium chloride   DISCONTD: sodium chloride Last Rate: 200 mL/hr at 10/16/11 1655    Estimated Nutritional Needs:   Kcal: 1300-1500 Protein: 65-75 gm Fluid: > 1.5 L  RD spoke with pt's daughter-in-law re: nutrition hx -- reports pt's appetite has been poor PTA; reports she has been consuming < 75% of estimated needs given consumed smaller portions with meals and was holding food in her mouth, forgetting to swallow; has lost approximately 24 lbs x 2 month period; meets criteria for severe malnutrition in the context of chronic illness; no skin breakdown noted; amenable to RD ordering Ensure Clinical Strength between meals.  NUTRITION DIAGNOSIS: -Malnutrition (NI-5.2).  Status: Ongoing  RELATED TO: inadequate oral intake, dysphagia  AS EVIDENCE BY: < 75% intake of estimated energy requirement for > 1 month, 20% weight loss x 2 months  MONITORING/EVALUATION(Goals): Goal: meet >90% of estimated nutrition needs to promote energy & protein repletion Monitor: PO intake, weight, labs, I/O's  EDUCATION NEEDS: -No education needs identified at this time  INTERVENTION:  Add Ensure Clinical Strength PO TID (350 kcals, 13 gms protein per 8 fl oz bottle)  RD to follow for nutrition care plan  Dietitian #: [phone]  DOCUMENTATION CODES Per approved criteria  -Severe malnutrition in the context of chronic illness -Underweight    Alger Memos 10/17/2011, 3:06 PM

## 2011-10-17 NOTE — Evaluation (Signed)
Occupational Therapy Evaluation Patient Details Name: Kathy Horton MRN: [id number] DOB: 11/18/1921 Today's Date: 10/17/2011  Problem List: There is no problem list on file for this patient.   Past Medical History:  Past Medical History  Diagnosis Date  . Glaucoma     recently started on acetazolamide   . Stroke 2002    2 strokes 1 month apart, has some speech deficit at [place] from this stroke and per the son patient has been slow  since then. Was taking aspirin before but stopped for past may years from unknown reasons.   Marland Kitchen HTN (hypertension)     not on any home medications  . Anemia    Past Surgical History:  Past Surgical History  Procedure Date  . Right wrist     OT Assessment/Plan/Recommendation OT Assessment Clinical Impression Statement: Pt admitted after fallling, found by son in her apartment.  Pt presents with problems described below thus affecting her safety and independence.  Will follow acutely to address ADL and mobility for ADL.  Recommend home with HHOT, 3 in 1 and 24 hour supervision.  Daughter in law states that the family can provide as much assist as needed. OT Recommendation/Assessment: Patient will need skilled OT in the acute care venue OT Problem List: Decreased strength;Decreased activity tolerance;Impaired balance (sitting and/or standing);Decreased knowledge of use of DME or AE;Pain OT Therapy Diagnosis : Generalized weakness;Acute pain OT Plan OT Frequency: Min 2X/week OT Treatment/Interventions: Self-care/ADL training;Therapeutic activities;DME and/or AE instruction;Patient/family education;Balance training OT Recommendation Follow Up Recommendations: Home health OT;Supervision/Assistance - 24 hour Equipment Recommended: 3 in 1 bedside comode Individuals Consulted Consulted and Agree with Results and Recommendations: Patient;Family member/caregiver Family Member Consulted: daughter in law OT Goals Acute Rehab OT Goals OT Goal Formulation:  With patient Time For Goal Achievement: 2 weeks ADL Goals Pt Will Perform Grooming: with supervision;Standing at sink (at least 2 activities) ADL Goal: Grooming - Progress: Goal set today Pt Will Perform Upper Body Bathing: with supervision;Sitting, edge of bed ADL Goal: Upper Body Bathing - Progress: Goal set today Pt Will Perform Lower Body Bathing: with min assist;Sitting, edge of bed;Sit to stand from bed ADL Goal: Lower Body Bathing - Progress: Goal set today Pt Will Perform Upper Body Dressing: with set-up;Sitting, bed ADL Goal: Upper Body Dressing - Progress: Goal set today Pt Will Perform Lower Body Dressing: with min assist;Sit to stand from bed;Sitting, bed ADL Goal: Lower Body Dressing - Progress: Goal set today Pt Will Transfer to Toilet: with supervision;3-in-1;Ambulation (over toilet) ADL Goal: Toilet Transfer - Progress: Goal set today Pt Will Perform Toileting - Clothing Manipulation: with supervision;Standing ADL Goal: Toileting - Clothing Manipulation - Progress: Goal set today  OT Evaluation Precautions/Restrictions  Precautions Precautions: Fall Restrictions Weight Bearing Restrictions: No Prior Functioning Home Living Lives With: Alone Receives Help From: Family Type of Home: Apartment Home Layout: One level Home Access: Elevator (pt able to use stairs ) Bathroom Shower/Tub: Forensic scientist: Standard Home Adaptive Equipment: Shower chair without back;Hand-held shower hose Prior Function Level of Independence: Independent with basic ADLs;Independent with gait;Independent with transfers;Independent with homemaking with ambulation;Requires assistive device for independence Able to Take Stairs?: Yes Driving: No ADL ADL Eating/Feeding: Performed;Set up (pt eating applesauce, drinking water) Where Assessed - Eating/Feeding: Bed level Grooming: Performed;Wash/dry hands;Set up Where Assessed - Grooming: Sitting, chair Upper Body  Bathing: Simulated;Minimal assistance Where Assessed - Upper Body Bathing: Sitting, bed Lower Body Bathing: Simulated;Maximal assistance Where Assessed - Lower Body Bathing: Sitting, bed;Sit to stand  from bed Upper Body Dressing: Simulated;Minimal assistance Where Assessed - Upper Body Dressing: Sitting, bed Lower Body Dressing: Performed;Maximal assistance Where Assessed - Lower Body Dressing: Sitting, chair Toilet Transfer: Performed;+2 Total assistance;Comment for patient % (75) Toilet Transfer Details (indicate cue type and reason): Pt fearful of falling, stood with knees bent for BP Toilet Transfer Method: Stand pivot Acupuncturist: Set designer - Clothing Manipulation: Performed;Maximal assistance Where Assessed - Glass blower/designer Manipulation: Standing Toileting - Hygiene: Performed;Set up Where Assessed - Toileting Hygiene: Sit on 3-in-1 or toilet Vision/Perception  Vision - History Visual History: Glaucoma Patient Visual Report: No change from baseline Cognition Cognition Arousal/Alertness: Awake/alert Overall Cognitive Status: Appears within functional limits for tasks assessed Orientation Level: Oriented X4 Cognition - Other Comments: Daughter in law reports pt at baseline. Sensation/Coordination Sensation Light Touch: Appears Intact Hot/Cold: Appears Intact Proprioception: Appears Intact Coordination Fine Motor Movements are Fluid and Coordinated: Yes Extremity Assessment RUE Assessment RUE Assessment: Within Functional Limits LUE Assessment LUE Assessment: Within Functional Limits Mobility  Bed Mobility Bed Mobility: Yes Rolling Left: 5: Supervision;With rail Left Sidelying to Sit: 5: Supervision;With rails Transfers Transfers: Yes Sit to Stand: 1: +2 Total assist;Patient percentage (comment);From bed;With upper extremity assist (75) Stand to Sit: 1: +2 Total assist;To chair/3-in-1;Patient percentage (comment);With upper extremity  assist (75) Stand to Sit Details: Verbal cues for hand placement End of Session OT - End of Session Equipment Utilized During Treatment: Gait belt Activity Tolerance: Patient tolerated treatment well Patient left: in chair;with call bell in reach;with family/visitor present General Behavior During Session: [facility] for tasks performed Cognition: [facility] for tasks performed (Pt with difficulty dividing attention.)   Evern Bio 10/17/2011, 10:34 AM

## 2011-10-17 NOTE — Progress Notes (Signed)
Pt manual BP 198/80 at 0154. Pt pain level 8 out of 10, no paine medication ordered. MD notified. 5mg  Hydralazine and 0.5-1mg  morphine ordered. Will continue to monitor.

## 2011-10-18 LAB — BASIC METABOLIC PANEL
CO2: 16 mEq/L — ABNORMAL LOW (ref 19–32)
Chloride: 115 mEq/L — ABNORMAL HIGH (ref 96–112)
GFR calc non Af Amer: 64 mL/min — ABNORMAL LOW (ref >90)
Glucose, Bld: 90 mg/dL (ref 70–99)
Potassium: 3.5 mEq/L (ref 3.5–5.1)
Sodium: 141 mEq/L (ref 135–145)

## 2011-10-18 MED ORDER — AMLODIPINE BESYLATE 5 MG PO TABS
5.0000 mg | ORAL_TABLET | Freq: Every day | ORAL | Status: DC
  Administered 2011-10-18 – 2011-10-19 (×2): 5 mg via ORAL
  Filled 2011-10-18 (×2): qty 1

## 2011-10-18 MED ORDER — LEVOTHYROXINE SODIUM 25 MCG PO TABS
25.0000 ug | ORAL_TABLET | Freq: Every day | ORAL | Status: DC
  Administered 2011-10-18 – 2011-10-19 (×2): 25 ug via ORAL
  Filled 2011-10-18 (×3): qty 1

## 2011-10-18 NOTE — Progress Notes (Signed)
Internal Medicine Teaching Service Attending Note Date: 10/18/2011  Patient name: Kathy Horton  Medical record number: [id number]  Date of birth: 1921-11-01    This patient has been seen and discussed with the house staff. Please see their note for complete details. I concur with their findings with the following additions/corrections:Ms. Kathy Horton is more oriented abnd orthostasis has resolved. Her able daughter in law is going to live with patient upon discharge and this is good for patient. If she can ambulate on own today then she can be discharged today or tomorrow.  Lina Sayre 10/18/2011, 3:40 PM

## 2011-10-18 NOTE — Progress Notes (Signed)
Met with pt and daughter in law re d/c needs. Number for [facility] given to pt family to call re PCP in the area accepting new patients.  Rolling walker and 3:1 ordered, daughter in law selected [facility] for HHPT and HHRN. [facility] notified.  Johny Shock RN MPH Case Manager [phone]

## 2011-10-18 NOTE — Progress Notes (Signed)
Subjective:  Family believes her mental status is now at baseline.  She still is afraid to stand up, afraid that she might fall because she feels weak.  Feels a bit stronger than yesterday.  No overnight events.   Objective: Vital signs in last 24 hours: Filed Vitals:   10/18/11 0909 10/18/11 0911 10/18/11 0912 10/18/11 0918  BP: 182/64 197/81 208/87 216/89  Pulse: 75 77 85 84  Temp:      TempSrc:      Resp:      Height:      Weight:      SpO2:       Weight change:   Intake/Output Summary (Last 24 hours) at 10/18/11 1312 Last data filed at 10/18/11 1300  Gross per 24 hour  Intake    240 ml  Output      3 ml  Net    237 ml   Physical Exam:  Alert and oriented x3, cooperative, no distress,  Head: Normocephalic, without obvious abnormality, atraumatic Eyes: PERRL, conjunctiva/corneas clear, EOM's intact  Neck: no carotid bruit or JVD  Lungs: Clear to auscultation bilaterally, respirations unlabored  Chest wall: No tenderness or deformity  Heart: Regular rate and rhythm, S1 and S2 normal, no murmur, rub or gallop  Upper Body: No tenderness to palpation over shoulders or chest.  Extremities: Extremities normal, atraumatic, no cyanosis or edema  Pulses: 2+ and symmetric all extremities  Skin: Skin color, texture, turgor normal, no rashes or lesions  Neurologic: oriented x3, CN 2-12 intact. Continues to improve subjectively in terms of interactiveness / gross mental acuity.  Lower extremity strength 4+/5 bilaterally.  Upper extremity strength 5/5 bilaterally.    Lab Results: Basic Metabolic Panel:  Lab 10/18/11 0981 10/17/11 0833 10/16/11 1619  NA 141 141 --  K 3.5 2.7* --  CL 115* 112 --  CO2 16* 11* --  GLUCOSE 90 76 --  BUN 9 12 --  CREATININE 0.80 0.76 --  CALCIUM 9.1 9.1 --  MG -- -- 2.1  PHOS -- -- 3.3   Liver Function Tests:  Lab 10/16/11 1619  AST 18  ALT 9  ALKPHOS 52  BILITOT 0.5  PROT 8.2  ALBUMIN 3.4*   CBC:  Lab 10/17/11 0833 10/16/11 0847    WBC 9.9 8.5  NEUTROABS -- 7.6  HGB 11.2* 11.8*  HCT 33.8* 35.3*  MCV 91.1 91.9  PLT 314 301   Cardiac Enzymes:  Lab 10/17/11 0833 10/16/11 2340 10/16/11 1618  CKTOTAL 168 155 203*  CKMB 5.4* 3.9 5.2*  CKMBINDEX -- -- --  TROPONINI <0.30 <0.30 <0.30   Hemoglobin A1C:  Lab 10/16/11 1619  HGBA1C 5.9*   Fasting Lipid Panel:  Lab 10/16/11 1630  CHOL 156  HDL 49  LDLCALC 93  TRIG 71  CHOLHDL 3.2  LDLDIRECT --   Thyroid Function Tests:  Lab 10/17/11 1016 10/16/11 1619  TSH -- 19.411*  T4TOTAL -- --  FREET4 0.65* --  T3FREE -- --  THYROIDAB -- --   Anemia Panel:  Lab 10/16/11 1619  VITAMINB12 323  FOLATE 13.8  FERRITIN 201  TIBC 233*  IRON 10*  RETICCTPCT 0.7   Urinalysis:  Lab 10/16/11 0911  COLORURINE YELLOW  LABSPEC 1.018  PHURINE 7.0  GLUCOSEU NEGATIVE  HGBUR NEGATIVE  BILIRUBINUR NEGATIVE  KETONESUR 15*  PROTEINUR 30*  UROBILINOGEN 1.0  NITRITE NEGATIVE  LEUKOCYTESUR NEGATIVE   Micro Results: Recent Results (from the past 240 hour(s))  URINE CULTURE  Status: Normal   Collection Time   10/16/11  9:11 AM      Component Value Range Status Comment   Specimen Description URINE, CATHETERIZED   Final    Special Requests NONE   Final    Culture  Setup Time [id number]   Final    Colony Count NO GROWTH   Final    Culture NO GROWTH   Final    Report Status 10/17/2011 FINAL   Final    Studies/Results: Dg Hip Bilateral W/pelvis  10/16/2011  *RADIOLOGY REPORT*  Clinical Data: Bilateral hip pain, fall  BILATERAL HIP WITH PELVIS - 4+ VIEW  Comparison: 10/16/2011 pelvic radiograph  Findings: Osseous demineralization. Bilateral narrowing of the hip joints. SI joints preserved. No acute fracture, dislocation, or bone destruction. Scattered atherosclerotic calcifications.  IMPRESSION: Osseous demineralization. Mild degenerative changes of the hip joints bilaterally. No acute bony abnormalities.  Original Report Authenticated By: Lollie Marrow, M.D.   Dg  Swallowing Func-no Report  10/17/2011  CLINICAL DATA: objectively evaluate for a structural dysphagia   FLUOROSCOPY FOR SWALLOWING FUNCTION STUDY:  Fluoroscopy was provided for swallowing function study, which was  administered by a speech pathologist.  Final results and recommendations  from this study are contained within the speech pathology report.     Medications: I have reviewed the patient's current medications. Scheduled Meds:   . aspirin EC  81 mg Oral Daily  . brimonidine  1 drop Left Eye BID  . dorzolamide-timolol  1 drop Both Eyes BID  . enoxaparin  30 mg Subcutaneous Q24H  . feeding supplement  237 mL Oral TID BM  . pantoprazole  40 mg Oral Q1200  . potassium chloride  40 mEq Oral Daily  . potassium chloride  40 mEq Oral Daily  . sodium chloride  3 mL Intravenous Q12H  . Travoprost (BAK Free)  1 drop Both Eyes QHS   Continuous Infusions:   . sodium chloride 100 mL/hr at 10/18/11 0405   PRN Meds:.acetaminophen, acetaminophen, hydrALAZINE, morphine injection, ondansetron (ZOFRAN) IV, ondansetron, polyvinyl alcohol   Assessment/Plan:   78 year old woman with past medical history of stroke and glaucoma was brought to the emergency department after she had a fall this morning and was not able to get up. Is no definite history of syncope or seizures but she has fluctuating mental status and slowing worse than her baseline since the onset of symptoms.   fall/AMS: Unclear the circumstances. Her mental status is now back to baseline per family. CT head was negative. Orthostatic hypotension has resolved.  No definite neuro deficits. This could possibly all have been due to acetazolamide therapy for her glaucoma? Decreased PO intake from living alone and dysphagia could also be contributing. Pelvis/hip XRays negative for fracture. Her untreated hypothyroidism could also be contributing. -Continue IVF at 100 cc/hr  -Daughter in law to live with her after discharge and provide 24 care.  HHN and HHPT  -PT/OT in house  -Synthroid as per below  history of stroke  - Patient does not have any neurological deficits at this time  - Inconsistently taking aspirin before this admission.  Will make sure she takes this daily on discharge.  Hypothyroidism: TSH 19.411. Free T4 0.65.  Starting synthroid 25 mcg PO daily.    Hypertension  - BPs very high here, but was orthostatic yesterday. No longer orthostatic today. -Will start amlodipine 5mg  PO daily.  Will keep her in hospital one more day to monitor pressures. - We need to ensure outpatient  followup for this   glaucoma  - Poor vision in the cause of her worsening dementia and falls  - Continue her eyedrops but discontinued acetazolamide for reasons mentioned above   dysphasia  - Patient complains of problem swallowing for past year or so  - Evaluated by swallow study by speech therapy today. They did not see a structural problem. Felt it was likely a motility problem, possibly related to GERD, but they explained that this was outside their scope of practice.  -Started PPI for trial of therapy. PCP follow-up should evaluate efficacy and consider GI referral for outpatient workup if she continues to have trouble.   osteopenia  - Seen on patient's x-rays done in the emergency room  - Again if we can make sure that she will follow up with her PCP, she really should be treated for this to prevent fractures. Will hold off bisphosphonate for now given dysphagia.   dispo  Patient and family wish for her to go live with daughter-in-law and son so she can have 24 hr care with Home health helping out. Home health PT as well.    LOS: 2 days   Yaakov Guthrie, BRAD 10/18/2011, 1:12 PM

## 2011-10-19 ENCOUNTER — Encounter: Payer: Self-pay | Admitting: Internal Medicine

## 2011-10-19 LAB — BASIC METABOLIC PANEL
CO2: 19 mEq/L (ref 19–32)
Calcium: 9.4 mg/dL (ref 8.4–10.5)
Chloride: 114 mEq/L — ABNORMAL HIGH (ref 96–112)
Glucose, Bld: 98 mg/dL (ref 70–99)
Sodium: 142 mEq/L (ref 135–145)

## 2011-10-19 MED ORDER — ACETAMINOPHEN 325 MG PO TABS: 650.0000 mg | ORAL_TABLET | Freq: Four times a day (QID) | ORAL | Status: AC | PRN

## 2011-10-19 MED ORDER — LEVOTHYROXINE SODIUM 25 MCG PO TABS: 25.0000 ug | ORAL_TABLET | Freq: Every day | ORAL | Status: DC

## 2011-10-19 MED ORDER — AMLODIPINE BESYLATE 5 MG PO TABS: 5.0000 mg | ORAL_TABLET | Freq: Every day | ORAL | Status: DC

## 2011-10-19 MED ORDER — ASPIRIN 81 MG PO TBEC: 81.0000 mg | DELAYED_RELEASE_TABLET | Freq: Every day | ORAL | Status: AC

## 2011-10-19 MED ORDER — ENSURE CLINICAL ST REVIGOR PO LIQD: 237.0000 mL | Freq: Three times a day (TID) | ORAL | Status: DC

## 2011-10-19 NOTE — Progress Notes (Signed)
Physical Therapy Treatment Patient Details Name: Kathy Horton MRN: [id number] DOB: 16-Jan-1922 Today's Date: 10/19/2011  PT Assessment/Plan  PT - Assessment/Plan Comments on Treatment Session: pt showed improved stability with RW and incr. tolerance for activity.  Pt's daughter in law related that she was going to assist her at pt's home after D/C and felt comfortable doing so at this point. PT Plan: Discharge plan remains appropriate Follow Up Recommendations: Home health PT;Supervision - Intermittent Equipment Recommended: 3 in 1 bedside comode;Rolling walker with 5" wheels;Other (comment) (RW recieed) PT Goals  Acute Rehab PT Goals PT Goal Formulation: With patient/family PT Goal: Supine/Side to Sit - Progress: Progressing toward goal PT Goal: Sit to Stand - Progress: Progressing toward goal PT Goal: Stand to Sit - Progress: Progressing toward goal PT Goal: Ambulate - Progress: Progressing toward goal  PT Treatment Precautions/Restrictions  Precautions Precautions: Fall Required Braces or Orthoses: No Restrictions Weight Bearing Restrictions: No Other Position/Activity Restrictions: HOH and visual deficits Mobility (including Balance) Bed Mobility Bed Mobility: Yes Rolling Left: 5: Supervision;With rail Left Sidelying to Sit: 5: Supervision;With rails Supine to Sit: 4: Min assist;HOB flat Supine to Sit Details (indicate cue type and reason): needed trunal assist to execute first 20 degrees Sitting - Scoot to Edge of Bed: 6: Modified independent (Device/Increase time) Transfers Sit to Stand: From bed;With upper extremity assist;4: Min assist ( ) Sit to Stand Details (indicate cue type and reason): minimal stability assist; vc's for hand placement Stand to Sit: To bed;Other (comment) (min guard A) Stand to Sit Details: vc's to reach back from RW Ambulation/Gait Ambulation/Gait: Yes Ambulation/Gait Assistance: 4: Min assist;Patient percentage (comment);Other (comment)  (pt >=85%) Ambulation/Gait Assistance Details (indicate cue type and reason): needed initial assist to maneuver RW and then some stability assist during scanning Ambulation Distance (Feet): 400 Feet Assistive device: Rolling walker Gait Pattern: Decreased step length - left;Decreased stance time - left;Trunk flexed (minor instabilities the wider the arc she scanned) Gait velocity: slower speed, but could notably increase her speed on cue Stairs: No  Posture/Postural Control Posture/Postural Control: No significant limitations Balance Balance Assessed: Yes Static Sitting Balance Static Sitting - Balance Support: No upper extremity supported;Feet supported Static Sitting - Level of Assistance: 6: Modified independent (Device/Increase time) Static Sitting - Comment/# of Minutes: 5 Exercise    End of Session PT - End of Session Activity Tolerance: Patient tolerated treatment well Patient left: with call bell in reach;with family/visitor present;Other (comment) (sitting EOB) Nurse Communication: Mobility status for transfers General Behavior During Session: [facility] for tasks performed Cognition: [facility] for tasks performed  Sharmaine Bain, Eliseo Gum 10/19/2011, 11:37 AM  10/19/2011  Troy Bing, PT [phone] (pager)

## 2011-10-19 NOTE — Progress Notes (Signed)
Subjective: Patient was able to walk with physical therapy today.  She and her family wish for her to be discharged today.  Pain continues to do well.  BPs improved after amlodipine started.   Objective: Vital signs in last 24 hours: Filed Vitals:   10/18/11 0918 10/18/11 1422 10/18/11 2100 10/19/11 0500  BP: 216/89 180/81 153/89 166/73  Pulse: 84 80 89 70  Temp:  97.5 F (36.4 C) 98.4 F (36.9 C) 97.8 F (36.6 C)  TempSrc:   Oral Oral  Resp:  18 20 19   Height:      Weight:    96 lb 8 oz (43.772 kg)  SpO2:  95% 100% 100%   Weight change: 8 oz (0.227 kg)  Intake/Output Summary (Last 24 hours) at 10/19/11 1352 Last data filed at 10/19/11 1100  Gross per 24 hour  Intake    480 ml  Output      1 ml  Net    479 ml   Physical Exam: Alert and oriented x3, cooperative, no distress,  Head: Normocephalic, without obvious abnormality, atraumatic Eyes: PERRL, conjunctiva/corneas clear, EOM's intact  Neck: no carotid bruit or JVD  Lungs: Clear to auscultation bilaterally, respirations unlabored  Chest wall: No tenderness or deformity  Heart: Regular rate and rhythm, S1 and S2 normal, no murmur, rub or gallop  Extremities: Extremities normal, atraumatic, no cyanosis or edema  Pulses: 2+ and symmetric all extremities  Skin: Skin color, texture, turgor normal, no rashes or lesions  Neurologic: oriented x3, CN 2-12 intact.   Lab Results: Basic Metabolic Panel:  Lab 10/19/11 0981 10/18/11 0505 10/16/11 1619  NA 142 141 --  K 3.8 3.5 --  CL 114* 115* --  CO2 19 16* --  GLUCOSE 98 90 --  BUN 9 9 --  CREATININE 0.85 0.80 --  CALCIUM 9.4 9.1 --  MG -- -- 2.1  PHOS -- -- 3.3   Liver Function Tests:  Lab 10/16/11 1619  AST 18  ALT 9  ALKPHOS 52  BILITOT 0.5  PROT 8.2  ALBUMIN 3.4*   CBC:  Lab 10/17/11 0833 10/16/11 0847  WBC 9.9 8.5  NEUTROABS -- 7.6  HGB 11.2* 11.8*  HCT 33.8* 35.3*  MCV 91.1 91.9  PLT 314 301   Cardiac Enzymes:  Lab 10/17/11 0833 10/16/11 2340  10/16/11 1618  CKTOTAL 168 155 203*  CKMB 5.4* 3.9 5.2*  CKMBINDEX -- -- --  TROPONINI <0.30 <0.30 <0.30   Hemoglobin A1C:  Lab 10/16/11 1619  HGBA1C 5.9*   Fasting Lipid Panel:  Lab 10/16/11 1630  CHOL 156  HDL 49  LDLCALC 93  TRIG 71  CHOLHDL 3.2  LDLDIRECT --   Thyroid Function Tests:  Lab 10/17/11 1016 10/16/11 1619  TSH -- 19.411*  T4TOTAL -- --  FREET4 0.65* --  T3FREE -- --  THYROIDAB -- --   Anemia Panel:  Lab 10/16/11 1619  VITAMINB12 323  FOLATE 13.8  FERRITIN 201  TIBC 233*  IRON 10*  RETICCTPCT 0.7   Urinalysis:  Lab 10/16/11 0911  COLORURINE YELLOW  LABSPEC 1.018  PHURINE 7.0  GLUCOSEU NEGATIVE  HGBUR NEGATIVE  BILIRUBINUR NEGATIVE  KETONESUR 15*  PROTEINUR 30*  UROBILINOGEN 1.0  NITRITE NEGATIVE  LEUKOCYTESUR NEGATIVE    Micro Results: Recent Results (from the past 240 hour(s))  URINE CULTURE     Status: Normal   Collection Time   10/16/11  9:11 AM      Component Value Range Status Comment   Specimen  Description URINE, CATHETERIZED   Final    Special Requests NONE   Final    Culture  Setup Time [id number]   Final    Colony Count NO GROWTH   Final    Culture NO GROWTH   Final    Report Status 10/17/2011 FINAL   Final    Medications: I have reviewed the patient's current medications. Scheduled Meds:   . amLODipine  5 mg Oral Daily  . aspirin EC  81 mg Oral Daily  . brimonidine  1 drop Left Eye BID  . dorzolamide-timolol  1 drop Both Eyes BID  . enoxaparin  30 mg Subcutaneous Q24H  . feeding supplement  237 mL Oral TID BM  . levothyroxine  25 mcg Oral QAC breakfast  . pantoprazole  40 mg Oral Q1200  . potassium chloride  40 mEq Oral Daily  . sodium chloride  3 mL Intravenous Q12H  . Travoprost (BAK Free)  1 drop Both Eyes QHS  . DISCONTD: potassium chloride  40 mEq Oral Daily   Continuous Infusions:   . DISCONTD: sodium chloride 100 mL/hr at 10/18/11 0405   PRN Meds:.acetaminophen, acetaminophen, hydrALAZINE, morphine  injection, ondansetron (ZOFRAN) IV, ondansetron, polyvinyl alcohol   Assessment/Plan: Active Problems:  * No active hospital problems. *   78 year old woman with past medical history of stroke and glaucoma was brought to the emergency department after she had a fall that morning and was not able to get up. Has no definite history of syncope or seizures but she had fluctuating mental status and slowing worse than her baseline since the onset of symptoms.    fall/AMS: Unclear the circumstances. Her mental status is now back to baseline per family. Normally interactive today for someone of her age.  CT head was negative. Orthostatic hypotension has resolved. No neuro deficits. This could possibly all have been due to acetazolamide therapy for her glaucoma? Decreased PO intake from living alone and dysphagia could also be contributing. Pelvis/hip XRays negative for fracture. Her untreated hypothyroidism could also be contributing.  -Ready for discharge today -Encouraged to eat and drink well after discharge -Home health PT and RN setup -Given number to call to find a PCP for follow-up  -Daugher-in-law to live with her for 24 hr care -Synthroid as per below   history of stroke  - Patient does not have any neurological deficits at this time  - Inconsistently taking aspirin before this admission. Will make sure she takes this daily on discharge.   Hypothyroidism: TSH 19.411. Free T4 0.65. Started synthroid 25 mcg PO daily.   Hypertension  -BPs improved since starting amlodipine.  Safe for discharge.  Will need PCP follow-up for continued management of this.  glaucoma  - Poor vision in the cause of her worsening dementia and falls  - Continue her eyedrops but discontinued acetazolamide for reasons mentioned above   dysphasia  - Patient complains of problem swallowing for past year or so  - Evaluated by swallow study by speech therapy today. They did not see a structural problem. Felt it was  likely a motility problem, possibly related to GERD, but they explained that this was outside their scope of practice.  -Started PPI for trial of therapy. PCP follow-up should evaluate efficacy and consider GI referral for outpatient workup if she continues to have trouble.   osteopenia  - Seen on patient's x-rays done in the emergency room  - Again if we can make sure that she will follow up with  her PCP, she really should be treated for this to prevent fractures. Will hold off bisphosphonate for now given dysphagia.       LOS: 3 days   Blanca Friend 10/19/2011, 1:52 PM

## 2011-10-19 NOTE — Progress Notes (Signed)
Occupational Therapy Treatment Patient Details Name: Kathy Horton MRN: [id number] DOB: Jun 13, 1922 Today's Date: 10/19/2011  OT Assessment/Plan OT Assessment/Plan Comments on Treatment Session: Pt progressing well. Caregiver in room throughout session OT Plan: Discharge plan remains appropriate Follow Up Recommendations: Home health OT;Supervision/Assistance - 24 hour Equipment Recommended: 3 in 1 bedside comode;Rolling walker with 5" wheels OT Goals ADL Goals ADL Goal: Grooming - Progress: Progressing toward goals ADL Goal: Toilet Transfer - Progress: Progressing toward goals ADL Goal: Toileting - Clothing Manipulation - Progress: Progressing toward goals Additional ADL Goal #1: Pt will be I with bilateral UE HEP. ADL Goal: Additional Goal #1 - Progress: Goal set today  OT Treatment Precautions/Restrictions  Precautions Precautions: Fall Restrictions Weight Bearing Restrictions: No   ADL ADL Grooming: Performed;Wash/dry hands;Supervision/safety Where Assessed - Grooming: Standing at sink Toilet Transfer: Performed;Minimal Dentist Details (indicate cue type and reason): Min A with RW ambulation. VC to take longer steps and assist for navigation around sink and in bathroom. VC to hold to grab bar to sit and stand from toilet  Toilet Transfer Method: Proofreader: Regular height toilet;Grab bars Toileting - Clothing Manipulation: Performed;Minimal assistance Toileting - Clothing Manipulation Details (indicate cue type and reason): pt fearful of letting go of RW. encouraged pt to do so, ensuring her I would not let her fall.  Where Assessed - Toileting Clothing Manipulation: Standing Toileting - Hygiene: Performed;Supervision/safety;Set up Where Assessed - Toileting Hygiene: Sit on 3-in-1 or toilet Tub/Shower Transfer: Not assessed Ambulation Related to ADLs: Pt Min A with RW ambulation. Assist to navigate around objects and cues to  take longer steps and look up while walking. Mobility  Bed Mobility Rolling Left: 5: Supervision;With rail Left Sidelying to Sit: 5: Supervision;With rails Transfers Sit to Stand: 4: Min assist;From bed;From toilet Sit to Stand Details (indicate cue type and reason): VC to lean forward to stand as pt tending to place weight through heels while attempting to stand Stand to Sit: 3: Mod assist;To chair/3-in-1;To toilet;Without upper extremity assist Stand to Sit Details: assist to control descent Exercises    End of Session  Pt left in chair with caregiver in room and call bell within reach.  Kathy Horton  10/19/2011, 9:30 AM

## 2011-10-19 NOTE — Discharge Summary (Signed)
Internal Medicine [facility] Discharge Note  Name: Kathy Horton MRN: [id number] DOB: 11/13/1921 78 y.o.  Date of Admission: 10/16/2011  8:01 AM Date of Discharge: 10/19/2011 Attending Physician: Lina Sayre, MD  Discharge Diagnosis: Fall Altered Mental Status Orthostatic Hypotension Dysphagia Hypertension Glaucoma History of Stroke Osteopenia    Discharge Medications: Medication List  As of 10/19/2011  1:59 PM   STOP taking these medications         acetaZOLAMIDE 500 MG capsule         TAKE these medications         acetaminophen 325 MG tablet   Commonly known as: TYLENOL   Take 2 tablets (650 mg total) by mouth every 6 (six) hours as needed for pain.      amLODipine 5 MG tablet   Commonly known as: NORVASC   Take 1 tablet (5 mg total) by mouth daily.      aspirin 81 MG EC tablet   Take 1 tablet (81 mg total) by mouth daily.      brimonidine 0.1 % Soln   Commonly known as: ALPHAGAN P   Place 1 drop into the left eye every 12 (twelve) hours.      dorzolamide-timolol 22.3-6.8 MG/ML ophthalmic solution   Commonly known as: COSOPT   Place 1 drop into both eyes 2 (two) times daily.      feeding supplement Liqd   Take 237 mLs by mouth 3 (three) times daily between meals.      levothyroxine 25 MCG tablet   Commonly known as: SYNTHROID, LEVOTHROID   Take 1 tablet (25 mcg total) by mouth daily before breakfast.      SYSTANE ULTRA 0.4-0.3 % Soln   Generic drug: Polyethyl Glycol-Propyl Glycol   Place 1 drop into both eyes 2 (two) times daily.      Travoprost (BAK Free) 0.004 % Soln ophthalmic solution   Commonly known as: TRAVATAN   Place 1 drop into both eyes at bedtime.            Disposition and follow-up:   Kathy Horton was discharged from [facility] in Stable condition.    Follow-up Appointments: Follow-up Information    Follow up with Your new primary care doctor in 2 weeks. (Please call the number given  to you by the case manager to find a new primary care doctor and schedule an appointment for 1-2 weeks)       Follow up with Blanca Friend, MD. (This is the physician who saw you in the hospital.  Please call with any questions)    Contact information:   [address] [place] [address] [phone], [phone]         Discharge Orders    Future Orders Please Complete By Expires   Walker rolling      Commode elevated 3 in 1      Ambulatory referral to Home Health      Comments:   Please evaluate AZRA ABRELL for admission to [facility].  Disciplines requested: Nursing, Physical Therapy and Occupational Therapy  Services to provide: Strengthening Exercises and Evaluate, safety evaluation of home  Physician to follow patient's care (the person listed here will be responsible for signing ongoing orders): Referring Provider     Diet general      Home Health      Questions: Responses:   To provide the following care/treatments PT    RN   Face-to-face  encounter      Comments:   I Blanca Friend certify that this patient is under my care and that I, or a nurse practitioner or physician's assistant working with me, had a face-to-face encounter that meets the physician face-to-face encounter requirements with this patient on 10/18/2011.  Patient is weak.  Was found at home after a fall.  Cannot stand on her own, in hospital has needed help for transfer to chair.  She needs home health PT.  Also home nurse RN eval for nursing needs and home safety evaluation.       Questions: Responses:   The encounter with the patient was in whole, or in part, for the following medical condition, which is the primary reason for home health care Weakness / poor nutrition   I certify that, based on my findings, the following services are medically necessary home health services Physical therapy    Nursing   My clinical findings support the need for the above services  OTHER SEE COMMENTS   Further, I certify that my clinical findings support that this patient is homebound (i.e. absences from home require considerable and taxing effort and are for medical reasons or religious services or infrequently or of short duration when for other reasons) OTHER SEE COMMENTS   To provide the following care/treatments PT    RN   Increase activity slowly      Call MD for:  difficulty breathing, headache or visual disturbances      Call MD for:  persistant dizziness or light-headedness      PT self-care home management   10/17/12      Consultations:    Procedures Performed:  Dg Chest 2 View  10/16/2011  *RADIOLOGY REPORT*  Clinical Data: Multiple falls.  Loss of consciousness.  Back pain.  CHEST - 2 VIEW  Comparison: 01/16/2005 and 03/30/2004.  Findings: Tortuous aorta with dilation of the proximal thoracic aorta.  Heart size within normal limits.  No infiltrate, congestive heart failure or pneumothorax.  Slight nodularity right heart border probably representing confluence of shadows.  Mild thoracic kyphosis.  Evaluation of thoracic spine limited by osteopenia.  If there is a question of thoracic spine injury, cone down views recommended.  IMPRESSION: Tortuous aorta.  Calcified dilated tortuous aorta.  Please see above.  Original Report Authenticated By: Fuller Canada, M.D.   Dg Lumbar Spine 2-3 Views  10/16/2011  *RADIOLOGY REPORT*  Clinical Data: Fall.  Pain.  LUMBAR SPINE - 2-3 VIEW  Comparison: None.  Findings: Scoliosis lumbar spine.  Mild loss of height lower thoracic vertebra and scattered mild Schmorl's node deformities without obvious compression fracture.  Evaluation is slightly limited by osteopenia.  Calcified aorta.  Caliber of the aorta not adequately assessed.  IMPRESSION: Scoliosis.  Osteopenia.  Degenerative changes.  Please see above.  Original Report Authenticated By: Fuller Canada, M.D.   Dg Pelvis 1-2 Views  10/16/2011  *RADIOLOGY REPORT*  Clinical Data:  Fall.  Back pain and hip pain.  PELVIS - 1-2 VIEW  Comparison: None.  Findings: No fracture.  Evaluation of the sacrum limited by overlying stool.  If hip fracture is of high clinical concern, cone down views can be obtained for further delineation.  Bones osteopenic.  IMPRESSION: No fracture.  Please see above.  Original Report Authenticated By: Fuller Canada, M.D.   Dg Hip Bilateral Vito Berger  10/16/2011  *RADIOLOGY REPORT*  Clinical Data: Bilateral hip pain, fall  BILATERAL HIP WITH PELVIS - 4+ VIEW  Comparison: 10/16/2011 pelvic radiograph  Findings: Osseous demineralization. Bilateral narrowing of the hip joints. SI joints preserved. No acute fracture, dislocation, or bone destruction. Scattered atherosclerotic calcifications.  IMPRESSION: Osseous demineralization. Mild degenerative changes of the hip joints bilaterally. No acute bony abnormalities.  Original Report Authenticated By: Lollie Marrow, M.D.   Ct Head Wo Contrast  10/16/2011  *RADIOLOGY REPORT*  Clinical Data: 78 year old female with falls.  Pain.  Loss of consciousness.  CT HEAD WITHOUT CONTRAST  Technique:  Contiguous axial images were obtained from the base of the skull through the vertex without contrast.  Comparison: None.  Findings: No acute orbit or scalp soft tissue findings. Visualized paranasal sinuses and mastoids are clear.  No acute osseous abnormality identified.  Calcified atherosclerosis at the skull base.  Mild ventriculomegaly appears related to ex vacuo changes. No midline shift, mass effect, or evidence of mass lesion.  No acute intracranial hemorrhage identified.  Patchy confluent white matter hypodensity. No evidence of cortically based acute infarction identified.  No suspicious intracranial vascular hyperdensity.  IMPRESSION: No acute intracranial abnormality.  Volume loss and mild to moderate for age nonspecific white matter changes.  Original Report Authenticated By: Harley Hallmark, M.D.   Dg Swallowing Func-no  Report  10/17/2011  CLINICAL DATA: objectively evaluate for a structural dysphagia   FLUOROSCOPY FOR SWALLOWING FUNCTION STUDY:  Fluoroscopy was provided for swallowing function study, which was  administered by a speech pathologist.  Final results and recommendations  from this study are contained within the speech pathology report.       Admission HPI:  78 year old woman with past medical history of stroke in 2002 and glaucoma was brought into the emergency department by her son this morning after he found her on the floor unable to get up. The patient has fluctuating mental status and is able to give some parts of the history but most of the information is obtained from the son . As per the son , the patient lives alone and is pretty functional at baseline. He checks on her every day. He went to see her this morning and that he knocked on the door the patient would not open the door but he could hear her speaking. She was asking him to come to the back door as she was unable to get up. The son got inside the house using a spare key and found the patient on the floor in between the bedroom and the living room. The patient was awake and communicative but had spells where she was not making any sense. She states she had a fall in the morning and was not able to get up. There is no history of syncope or seizures. The son did not notice any bowel or bladder incontinence when he found her. He is not sure how long the patient was lying on the floor and the patient does not remember how she got there. She wasn't complaining of any pain except for back and hip pain that she has been having for past few weeks after a fall she had at that time. She had her dinner the night before she went to bed but she has not been eating too well in past few weeks. She was recently started on acetazolamide for glaucoma which is the only medication she takes in addition to her eyedrops. The family denies any other change in her social  and mental status lately. She has not been seen by a primary care physician for more than 10  years.  Since the patient has come to the ED, she has been drowsy but alert and oriented. She denies any complaints except for back pain when tried to move her and difficulty swallowing solids and liquids for past year or so.    Hospital Course by problem list:   Fall / Altered Mental Status / Orthostatic Hypotension: Patient was found on floor of her apt by her son who checked on her in the morning.  She believes that she fell during the night walking to the bathroom, but she does not remember these events well.  Per son, she was "delirious" when he came in and did not know where she was at first.    She had significant orthostatic hypotension on admission as well, but admission BMET did not show any metabolic abnormalities.  Potassium did fall to 2.7 on hospital day #2 for unknown reason, but it was supplemented and remained stable in normal range for remainder of hospitalization.  Orthostatic hypotension fully resolved by hospital day #2 with IV fluids.    Mental status slowly improved throughout hospitalization.  Initially she was alert and oriented x 3 but had a somewhat blunted affect and only could answer some questions.  By hospital day #3 she had improved to her baseline per family, normally interactive and could answer all question.     On admission, head CT showed no acute abnormality and hip and pelvis XRays showed no fractures.  She had some hip and back pains that slowly resolved, no bruising.  It was suspected by the primary team that acetazolamide, started 2 weeks before admission for her glaucoma, may have contributed.  She reported increased urination since starting this medicine, so over diuresis / hypovolemia likely contributed to her presentation.  This medicine was stopped on admission and after discharge, while her glaucoma drops were all continued throughout.  She was also found to  have uncontrolled hypothyroidism, which could also have contributed.  Synthroid was started, and TSH should be followed by her PCP.  PT evaluated the patient and recommended home health PT.  This was arranged.  Patient's family is to live with her 24 hrs a day to provide 24 hr care for as long as needed.      Dysphagia: Patient and family report some dysphagia ever since strokes 10 yrs ago, but it has been worsening in the past year.  Has to take medications by putting them in apple sauce.  Was evaluated by speech therapy who performed swallow study.  This did not show any anatomic abnormality per speech therapist.  It was felt to be more of a motility issue.  Possible due to past stroke or GERD.  In hospital PPI treatment did not seem to make an appreciable difference.  PCP should discuss this further and consider a longer PPI trial if PCP's impression is that it may be beneficial.  Hypertension: Blood pressures in the days before discharge ranged as high as 216 systolic off any medications.  Diastolic pressures were largely normal.  Amlodipine 5mg  PO daily was started day before discharge.  On day of discharge, BP was 163/73.  We did not feel comfortable pushing her pressures any lower acutely due to her presentation with orthostatic hypotension.  BP should be followed and medication adjusted as needed by her new primary care physician.  Glaucoma: Discharged on her home eye drop regimen, but PO acetazolamide was discontinued due to orthostatic hypotension on admission and possible contribution of this medication to her altered  mental status/fall.  Osteopenia: Xrays performed to evaluate for fracture on admission showed no fractures but did show osteopenia.  Further work-up and treatment was deferred to the outpatient setting.  Her new PCP should consider possible outpatient therapy for this.  Please keep in mind her dysphagia if considering bisphosponate therapy.  Hypothyroidism: TSH elevated, Free  T4 suppressed.  Synthroid was started for uncontrolled hypothyroidism.  New PCP should follow-up TSH in 6-8 weeks.     Discharge Vitals:  BP 166/73  Pulse 70  Temp(Src) 97.8 F (36.6 C) (Oral)  Resp 19  Ht 5\' 4"  (1.626 m)  Wt 96 lb 8 oz (43.772 kg)  BMI 16.56 kg/m2  SpO2 100%  Discharge Labs:  Results for orders placed during the hospital encounter of 10/16/11 (from the past 24 hour(s))  BASIC METABOLIC PANEL     Status: Abnormal   Collection Time   10/19/11  6:30 AM      Component Value Range   Sodium 142  135 - 145 (mEq/L)   Potassium 3.8  3.5 - 5.1 (mEq/L)   Chloride 114 (*) 96 - 112 (mEq/L)   CO2 19  19 - 32 (mEq/L)   Glucose, Bld 98  70 - 99 (mg/dL)   BUN 9  6 - 23 (mg/dL)   Creatinine, Ser 1.61  0.50 - 1.10 (mg/dL)   Calcium 9.4  8.4 - 09.6 (mg/dL)   GFR calc non Af Amer 60 (*) >90 (mL/min)   GFR calc Af Amer 69 (*) >90 (mL/min)    Admission on 10/16/2011  Component Date Value Range Status  . WBC (K/uL) 10/16/2011 8.5  4.0-10.5 Final  . RBC (MIL/uL) 10/16/2011 3.84* 3.87-5.11 Final  . Hemoglobin (g/dL) 04/54/0981 19.1* 47.8-29.5 Final  . HCT (%) 10/16/2011 35.3* 36.0-46.0 Final  . MCV (fL) 10/16/2011 91.9  78.0-100.0 Final  . MCH (pg) 10/16/2011 30.7  26.0-34.0 Final  . MCHC (g/dL) 62/13/0865 78.4  69.6-29.5 Final  . RDW (%) 10/16/2011 13.6  11.5-15.5 Final  . Platelets (K/uL) 10/16/2011 301  150-400 Final  . Neutrophils Relative (%) 10/16/2011 89* 43-77 Final  . Lymphocytes Relative (%) 10/16/2011 6* 12-46 Final  . Monocytes Relative (%) 10/16/2011 5  3-12 Final  . Eosinophils Relative (%) 10/16/2011 0  0-5 Final  . Basophils Relative (%) 10/16/2011 0  0-1 Final  . Neutro Abs (K/uL) 10/16/2011 7.6  1.7-7.7 Final  . Lymphs Abs (K/uL) 10/16/2011 0.5* 0.7-4.0 Final  . Monocytes Absolute (K/uL) 10/16/2011 0.4  0.1-1.0 Final  . Eosinophils Absolute (K/uL) 10/16/2011 0.0  0.0-0.7 Final  . Basophils Absolute (K/uL) 10/16/2011 0.0  0.0-0.1 Final  . Sodium (mEq/L)  10/16/2011 144  135-145 Final  . Potassium (mEq/L) 10/16/2011 3.7  3.5-5.1 Final  . Chloride (mEq/L) 10/16/2011 114* 96-112 Final  . CO2 (mEq/L) 10/16/2011 19  19-32 Final  . Glucose, Bld (mg/dL) 28/41/3244 010* 27-25 Final  . BUN (mg/dL) 36/64/4034 15  7-42 Final  . Creatinine, Ser (mg/dL) 59/56/3875 6.43  3.29-5.18 Final  . Calcium (mg/dL) 84/16/6063 01.6  0.1-09.3 Final  . GFR calc non Af Amer (mL/min) 10/16/2011 73* >90 Final  . GFR calc Af Amer (mL/min) 10/16/2011 85* >90 Final   Comment:                                 The eGFR has been calculated  using the CKD EPI equation.                          This calculation has not been                          validated in all clinical                          situations.                          eGFR's persistently                          <90 mL/min signify                          possible Chronic Kidney Disease.  . Troponin I (ng/mL) 10/16/2011 <0.30  <0.30 Final   Comment:                                 Due to the release kinetics of cTnI,                          a negative result within the first hours                          of the onset of symptoms does not rule out                          myocardial infarction with certainty.                          If myocardial infarction is still suspected,                          repeat the test at appropriate intervals.  . Lactic Acid, Venous (mmol/L) 10/16/2011 1.0  0.5-2.2 Final  . Color, Urine  10/16/2011 YELLOW  YELLOW Final  . APPearance  10/16/2011 CLEAR  CLEAR Final  . Specific Gravity, Urine  10/16/2011 1.018  1.005-1.030 Final  . pH  10/16/2011 7.0  5.0-8.0 Final  . Glucose, UA (mg/dL) 45/40/9811 NEGATIVE  NEGATIVE Final  . Hgb urine dipstick  10/16/2011 NEGATIVE  NEGATIVE Final  . Bilirubin Urine  10/16/2011 NEGATIVE  NEGATIVE Final  . Ketones, ur (mg/dL) 91/47/8295 15* NEGATIVE Final  . Protein, ur (mg/dL) 62/13/0865 30* NEGATIVE Final  .  Urobilinogen, UA (mg/dL) 78/46/9629 1.0  5.2-8.4 Final  . Nitrite  10/16/2011 NEGATIVE  NEGATIVE Final  . Leukocytes, UA  10/16/2011 NEGATIVE  NEGATIVE Final  . Specimen Description  10/16/2011 URINE, CATHETERIZED   Final  . Special Requests  10/16/2011 NONE   Final  . Culture  Setup Time  10/16/2011 [id number]   Final  . Colony Count  10/16/2011 NO GROWTH   Final  . Culture  10/16/2011 NO GROWTH   Final  . Report Status  10/16/2011 10/17/2011 FINAL   Final  . Total CK (U/L) 10/16/2011 216* 7-177 Final  . Squamous Epithelial / LPF  10/16/2011 RARE  RARE Final  .  WBC, UA (WBC/hpf) 10/16/2011 0-2  <3 Final  . RBC / HPF (RBC/hpf) 10/16/2011 0-2  <3 Final  . Bacteria, UA  10/16/2011 RARE  RARE Final  . Casts  10/16/2011 HYALINE CASTS* NEGATIVE Final  . Magnesium (mg/dL) 98/06/9146 2.1  8.2-9.5 Final  . Phosphorus (mg/dL) 62/13/0865 3.3  7.8-4.6 Final  . Total Protein (g/dL) 96/29/5284 8.2  1.3-2.4 Final  . Albumin (g/dL) 40/05/2724 3.4* 3.6-6.4 Final  . AST (U/L) 10/16/2011 18  0-37 Final  . ALT (U/L) 10/16/2011 9  0-35 Final  . Alkaline Phosphatase (U/L) 10/16/2011 52  39-117 Final  . Total Bilirubin (mg/dL) 40/34/7425 0.5  9.5-6.3 Final  . Bilirubin, Direct (mg/dL) 87/56/4332 <9.5  1.8-8.4 Final  . Indirect Bilirubin (mg/dL) 16/60/6301 NOT CALCULATED  0.3-0.9 Final  . TSH (uIU/mL) 10/16/2011 19.411* 0.350-4.500 Final  . Vitamin B-12 (pg/mL) 10/16/2011 323  211-911 Final  . Folate (ng/mL) 10/16/2011 13.8   Final   Comment: (NOTE)                          Reference Ranges                                 Deficient:       0.4 - 3.3 ng/mL                                 Indeterminate:   3.4 - 5.4 ng/mL                                 Normal:              > 5.4 ng/mL  . Iron (ug/dL) 60/05/9322 10* 55-732 Final  . TIBC (ug/dL) 20/25/4270 623* 762-831 Final  . Saturation Ratios (%) 10/16/2011 4* 20-55 Final  . UIBC (ug/dL) 51/76/1607 371  062-694 Final  . Ferritin (ng/mL) 10/16/2011 201   10-291 Final  . Retic Ct Pct (%) 10/16/2011 0.7  0.4-3.1 Final  . RBC. (MIL/uL) 10/16/2011 3.91  3.87-5.11 Final  . Retic Count, Manual (K/uL) 10/16/2011 27.4  19.0-186.0 Final  . Hemoglobin A1C (%) 10/16/2011 5.9* <5.7 Final   Comment: (NOTE)                                                                                                                         According to the ADA Clinical Practice Recommendations for 2011, when                          HbA1c is used as a screening test:                           >=6.5%  Diagnostic of Diabetes Mellitus                                    (if abnormal result is confirmed)                          5.7-6.4%   Increased risk of developing Diabetes Mellitus                          References:Diagnosis and Classification of Diabetes Mellitus,Diabetes                          Care,2011,34(Suppl 1):S62-S69 and Standards of Medical Care in                                  Diabetes - 2011,Diabetes Care,2011,34 (Suppl 1):S11-S61.  . Mean Plasma Glucose (mg/dL) 16/05/9603 540* <981 Final  . Cholesterol (mg/dL) 19/14/7829 562  1-308 Final  . Triglycerides (mg/dL) 65/78/4696 71  <295 Final  . HDL (mg/dL) 28/41/3244 49  >01 Final  . Total CHOL/HDL Ratio (RATIO) 10/16/2011 3.2   Final  . VLDL (mg/dL) 02/72/5366 14  4-40 Final  . LDL Cholesterol (mg/dL) 34/74/2595 93  6-38 Final   Comment:                                 Total Cholesterol/HDL:CHD Risk                          Coronary Heart Disease Risk Table                                              Men   Women                           1/2 Average Risk   3.4   3.3                           Average Risk       5.0   4.4                           2 X Average Risk   9.6   7.1                           3 X Average Risk  23.4   11.0                                                          Use the calculated Patient Ratio                          above and the CHD  Risk Table                           to determine the patient's CHD Risk.                                                          ATP III CLASSIFICATION (LDL):                           <100     mg/dL   Optimal                           100-129  mg/dL   Near or Above                                             Optimal                           130-159  mg/dL   Borderline                           160-189  mg/dL   High                           >190     mg/dL   Very High  . Total CK (U/L) 10/16/2011 203* 7-177 Final  . CK, MB (ng/mL) 10/16/2011 5.2* 0.3-4.0 Final  . Troponin I (ng/mL) 10/16/2011 <0.30  <0.30 Final   Comment:                                 Due to the release kinetics of cTnI,                          a negative result within the first hours                          of the onset of symptoms does not rule out                          myocardial infarction with certainty.                          If myocardial infarction is still suspected,                          repeat the test at appropriate intervals.  . Relative Index  10/16/2011 2.6* 0.0-2.5 Final  . Total CK (U/L) 10/16/2011 155  7-177 Final  . CK, MB (ng/mL) 10/16/2011 3.9  0.3-4.0 Final  . Troponin I (ng/mL) 10/16/2011 <0.30  <0.30 Final   Comment:  Due to the release kinetics of cTnI,                          a negative result within the first hours                          of the onset of symptoms does not rule out                          myocardial infarction with certainty.                          If myocardial infarction is still suspected,                          repeat the test at appropriate intervals.  . Relative Index  10/16/2011 2.5  0.0-2.5 Final  . Sodium (mEq/L) 10/17/2011 141  135-145 Final  . Potassium (mEq/L) 10/17/2011 2.7* 3.5-5.1 Final   Comment: CRITICAL RESULT CALLED TO, READ BACK BY AND VERIFIED WITH:                          SMITH A RN 10/17/11 0943 COSTELLO B  . Chloride (mEq/L)  10/17/2011 112  96-112 Final  . CO2 (mEq/L) 10/17/2011 11* 19-32 Final  . Glucose, Bld (mg/dL) 40/98/1191 76  47-82 Final  . BUN (mg/dL) 95/62/1308 12  6-57 Final  . Creatinine, Ser (mg/dL) 84/69/6295 2.84  1.32-4.40 Final  . Calcium (mg/dL) 06/09/2535 9.1  6.4-40.3 Final  . GFR calc non Af Amer (mL/min) 10/17/2011 74* >90 Final  . GFR calc Af Amer (mL/min) 10/17/2011 85* >90 Final   Comment:                                 The eGFR has been calculated                          using the CKD EPI equation.                          This calculation has not been                          validated in all clinical                          situations.                          eGFR's persistently                          <90 mL/min signify                          possible Chronic Kidney Disease.  . WBC (K/uL) 10/17/2011 9.9  4.0-10.5 Final  . RBC (MIL/uL) 10/17/2011 3.71* 3.87-5.11 Final  . Hemoglobin (g/dL) 47/42/5956 38.7* 56.4-33.2 Final  . HCT (%) 10/17/2011 33.8* 36.0-46.0 Final  . MCV (fL) 10/17/2011 91.1  78.0-100.0 Final  . MCH (pg)  10/17/2011 30.2  26.0-34.0 Final  . MCHC (g/dL) 65/78/4696 29.5  28.4-13.2 Final  . RDW (%) 10/17/2011 13.8  11.5-15.5 Final  . Platelets (K/uL) 10/17/2011 314  150-400 Final  . Total CK (U/L) 10/17/2011 168  7-177 Final  . CK, MB (ng/mL) 10/17/2011 5.4* 0.3-4.0 Final  . Troponin I (ng/mL) 10/17/2011 <0.30  <0.30 Final   Comment:                                 Due to the release kinetics of cTnI,                          a negative result within the first hours                          of the onset of symptoms does not rule out                          myocardial infarction with certainty.                          If myocardial infarction is still suspected,                          repeat the test at appropriate intervals.  . Relative Index  10/17/2011 3.2* 0.0-2.5 Final  . Free T4 (ng/dL) 44/08/270 5.36* 6.44-0.34 Final  . Sodium (mEq/L)  10/18/2011 141  135-145 Final  . Potassium (mEq/L) 10/18/2011 3.5  3.5-5.1 Final   DELTA CHECK NOTED  . Chloride (mEq/L) 10/18/2011 115* 96-112 Final  . CO2 (mEq/L) 10/18/2011 16* 19-32 Final  . Glucose, Bld (mg/dL) 74/25/9563 90  87-56 Final  . BUN (mg/dL) 43/32/9518 9  8-41 Final  . Creatinine, Ser (mg/dL) 66/01/3015 0.10  9.32-3.55 Final  . Calcium (mg/dL) 73/22/0254 9.1  2.7-06.2 Final  . GFR calc non Af Amer (mL/min) 10/18/2011 64* >90 Final  . GFR calc Af Amer (mL/min) 10/18/2011 75* >90 Final   Comment:                                 The eGFR has been calculated                          using the CKD EPI equation.                          This calculation has not been                          validated in all clinical                          situations.                          eGFR's persistently                          <90 mL/min signify  possible Chronic Kidney Disease.  . Sodium (mEq/L) 10/19/2011 142  135-145 Final  . Potassium (mEq/L) 10/19/2011 3.8  3.5-5.1 Final  . Chloride (mEq/L) 10/19/2011 114* 96-112 Final  . CO2 (mEq/L) 10/19/2011 19  19-32 Final  . Glucose, Bld (mg/dL) 95/28/4132 98  44-01 Final  . BUN (mg/dL) 02/72/5366 9  4-40 Final  . Creatinine, Ser (mg/dL) 34/74/2595 6.38  7.56-4.33 Final  . Calcium (mg/dL) 29/51/8841 9.4  6.6-06.3 Final  . GFR calc non Af Amer (mL/min) 10/19/2011 60* >90 Final  . GFR calc Af Amer (mL/min) 10/19/2011 69* >90 Final   Comment:                                 The eGFR has been calculated                          using the CKD EPI equation.                          This calculation has not been                          validated in all clinical                          situations.                          eGFR's persistently                          <90 mL/min signify                          possible Chronic Kidney Disease.  ]  SignedYaakov Guthrie, BRAD 10/19/2011, 1:59 PM

## 2011-10-19 NOTE — Progress Notes (Signed)
Speech Pathology: Dysphagia Treatment Note  Subjective: Awake, alert, sitting up in recliner, daughter-in-law present  Objective: Treatment focused on functional use of recommended esophageal phase strategies including alternating bites/sips and small size bolus.  Patient both verbalized and demonstrated these strategies independently.  Completed education with patient and her daughter-in-law regarding continuing these strategies indefinitely to facilitate esophageal clearance.  Reflux precautions reviewed.   Assessment:  Patient is independent with esophageal phase strategies.  Recommendations:  1. Continue Dys.3/ thin liquid diet 2. Alternate solids/liquids; small bites/sips 3. D/C skilled SLP services  Pain:   none Intervention Required:   No  Goals: All Goals Met  Myra Rude, M.S.,CCC-SLP Pager [phone]

## 2011-11-02 ENCOUNTER — Ambulatory Visit (INDEPENDENT_AMBULATORY_CARE_PROVIDER_SITE_OTHER): Payer: Medicare Other | Admitting: Internal Medicine

## 2011-11-02 DIAGNOSIS — I10 Essential (primary) hypertension: Secondary | ICD-10-CM

## 2011-11-02 DIAGNOSIS — R1310 Dysphagia, unspecified: Secondary | ICD-10-CM | POA: Insufficient documentation

## 2011-11-02 DIAGNOSIS — Z23 Encounter for immunization: Secondary | ICD-10-CM

## 2011-11-02 DIAGNOSIS — R4182 Altered mental status, unspecified: Secondary | ICD-10-CM

## 2011-11-02 DIAGNOSIS — E039 Hypothyroidism, unspecified: Secondary | ICD-10-CM

## 2011-11-02 DIAGNOSIS — I69391 Dysphagia following cerebral infarction: Secondary | ICD-10-CM

## 2011-11-02 DIAGNOSIS — M899 Disorder of bone, unspecified: Secondary | ICD-10-CM

## 2011-11-02 DIAGNOSIS — I69991 Dysphagia following unspecified cerebrovascular disease: Secondary | ICD-10-CM

## 2011-11-02 DIAGNOSIS — K219 Gastro-esophageal reflux disease without esophagitis: Secondary | ICD-10-CM

## 2011-11-02 DIAGNOSIS — H409 Unspecified glaucoma: Secondary | ICD-10-CM

## 2011-11-02 DIAGNOSIS — M8580 Other specified disorders of bone density and structure, unspecified site: Secondary | ICD-10-CM

## 2011-11-02 MED ORDER — DOCUSATE SODIUM 50 MG/5ML PO LIQD: 100.0000 mg | Freq: Every day | ORAL | Status: AC

## 2011-11-02 MED ORDER — OMEPRAZOLE 2 MG/ML ORAL SUSPENSION: 40.0000 mg | Freq: Every day | ORAL | Status: DC

## 2011-11-02 MED ORDER — AMLODIPINE BESYLATE 10 MG PO TABS: 10.0000 mg | ORAL_TABLET | Freq: Every day | ORAL | Status: DC

## 2011-11-02 NOTE — Assessment & Plan Note (Signed)
Continue Alphagan, Cosopt, systane, and travatan drops per ophthalmology

## 2011-11-02 NOTE — Progress Notes (Signed)
HPI: Kathy Horton is an 78 year old woman with PMH  Of glaucoma, HTN, hypothyroidism, osteopenia, CVA in 2002, dysphagia presents for hospital follow up accompanied by her daughter in law who was recently admitted to hospital 3/5-3/8 for altered mental status likely 2/2 orthostatic hypotension which thought to be from acetozolamide for her glaucoma.   She is eating better, well-balanced meal now.   Had a history of thyroid but was not on medication. No one worked up her thyroid.  No other autoimmune disorders.  She lost weight because of being sick. ++dry skin  Constipation: she has to sit for a long time and has to strain but she does have bowel movement on a daily basis.  Her mental status is almost back to baseline.  She is oriented and alert.  She is having physical therapy at home 2 times per week and the last session was yesterday.  Her strength is much better, able to walk around with a rolling walking and able to perform daily activities of living by herself with her son or daughter in law supervision.    Dysphagia: better, eating chopped food and slowly.    Cough: usually it is a dry cough.  She used to be a smoker but she quit over 10 years ago.  Denies any burning sensation. No history of acid reflux.  Wants Pneumococcal vaccine today  ROS: denies any fever, chills, abdominal pain, SOB or chest pain.  PE:  General: alert, thin appearing woman, and cooperative to examination.   Lungs: normal respiratory effort, no accessory muscle use, coarse breath sounds, no crackles, and no wheezes. Heart: normal rate, regular rhythm, no murmur, no gallop, and no rub.  Abdomen: soft, non-tender, normal bowel sounds, no distention, no guarding, no rebound tenderness Pulses: 2+ DP/PT pulses bilaterally Extremities: No cyanosis, clubbing.  Right ankle:mild edema and tender to palpation with some heel tenderness Neurologic: alert & oriented X3, cranial nerves II-XII intact, strength normal in all  extremities, sensation intact to light touch, and gait normal.  Skin: dry skin Psych: Alert &Oriented X3, pleasant

## 2011-11-02 NOTE — Patient Instructions (Signed)
Increase Norvasc 10mg  one tablet daily Start taking Colace 10ml once daily to help with your bowel movement Start taking Omeprazole 40mg  once daily for acid reflux/cough Get blood work today and I will call you with any abnormal results Follow up with Dr. Anselm Jungling in 4-6 weeks to repeat blood pressure and thyroid

## 2011-11-02 NOTE — Assessment & Plan Note (Signed)
I think her cough may be due to underlying GERD. Will try a trial of PPI: Omeprazole 40mg  po qd

## 2011-11-02 NOTE — Assessment & Plan Note (Addendum)
Not well-controlled, but given her hx of orthostatic hypotension, we will be cautious with her BP. Orthostatic vital today:  Lying 157/72, P 64 Sitting 168/75, P 68 Standing 155/78, P 71 -Increase Norvasc to 10 mg poqd for better BP control since she is not currently orthostatic

## 2011-11-02 NOTE — Assessment & Plan Note (Signed)
TSH 19.411 and Free T4 was 0.65.   -Will continue Synthroid and will repeat in 6-8 weeks from starting date of medication.   -I will repeat her TSH at the end of April

## 2011-11-02 NOTE — Progress Notes (Signed)
Addended by: Angelina Ok F on: 11/02/2011 11:25 AM   Modules accepted: Orders

## 2011-11-02 NOTE — Assessment & Plan Note (Signed)
Continue Aspirin 81 mg qd 

## 2011-11-02 NOTE — Assessment & Plan Note (Addendum)
This is a chronic problem for patient could be 2/2 esophageal stricture from chronic GERD vs. Old CVA.  She had a modified barium study in 10/2011 which showed structural dysphagia with differential diagnosis of esophageal phase based vs. a pharyngeal phase component (osteophytes, tight UES, etc.).  Per patient and daughter in law's report, she eats much better when the food is chopped up and if she eats slowly.   -Will try trial of Omeprazole liquid 40mg  qd

## 2011-11-02 NOTE — Assessment & Plan Note (Addendum)
Seen on Xray of lumbar and hip.  Will defer bisphosphonate at this time since patient has trouble swallowing.

## 2011-11-03 LAB — BASIC METABOLIC PANEL WITH GFR
BUN: 11 mg/dL (ref 6–23)
CO2: 25 mEq/L (ref 19–32)
Calcium: 9.7 mg/dL (ref 8.4–10.5)
Chloride: 105 mEq/L (ref 96–112)
Creat: 0.83 mg/dL (ref 0.50–1.10)
GFR, Est African American: 73 mL/min
GFR, Est Non African American: 64 mL/min
Glucose, Bld: 88 mg/dL (ref 70–99)
Potassium: 4.3 mEq/L (ref 3.5–5.3)
Sodium: 142 mEq/L (ref 135–145)

## 2011-11-09 ENCOUNTER — Telehealth: Payer: Self-pay | Admitting: Internal Medicine

## 2011-11-09 NOTE — Telephone Encounter (Signed)
Son called at 1 10 pm on 11/09/2011 about concerns for pts health. She was having intermittent confusion and anger fits for past 5-7 days. Was seen by her PCP in [facility] on 11/02/2011 and no major chanes were made which would essentially affect her Mental Status. She is on Glaucoma drops for past 2 months and had a recent admission in [facility]1 service in 1st week of March with AMS/fall/orthostatic Hypotension. She doesn't have any focal weakness, numbness, sigificant vision changes, headache. Her appetite is much better since hospital D/C on 10/16/11. Fluid intake is also good per son. She has no fever or chills.  Son was concerned about mini-stroke. Discussed with him on phone for 10-15 min and advised about waiting to see Mental status. If she develops any signs of stroke- advised to bring her to ER. Also he will call [phone] for any further questions or concerns.

## 2011-12-04 ENCOUNTER — Ambulatory Visit (INDEPENDENT_AMBULATORY_CARE_PROVIDER_SITE_OTHER): Payer: Medicare Other | Admitting: Internal Medicine

## 2011-12-04 VITALS — BP 153/69 | HR 69 | Temp 99.1°F | Wt 90.7 lb

## 2011-12-04 DIAGNOSIS — I10 Essential (primary) hypertension: Secondary | ICD-10-CM

## 2011-12-04 DIAGNOSIS — K5900 Constipation, unspecified: Secondary | ICD-10-CM

## 2011-12-04 DIAGNOSIS — L729 Follicular cyst of the skin and subcutaneous tissue, unspecified: Secondary | ICD-10-CM | POA: Insufficient documentation

## 2011-12-04 DIAGNOSIS — E039 Hypothyroidism, unspecified: Secondary | ICD-10-CM

## 2011-12-04 DIAGNOSIS — R221 Localized swelling, mass and lump, neck: Secondary | ICD-10-CM

## 2011-12-04 DIAGNOSIS — R220 Localized swelling, mass and lump, head: Secondary | ICD-10-CM

## 2011-12-04 DIAGNOSIS — K219 Gastro-esophageal reflux disease without esophagitis: Secondary | ICD-10-CM

## 2011-12-04 LAB — T4, FREE: Free T4: 0.85 ng/dL (ref 0.80–1.80)

## 2011-12-04 NOTE — Assessment & Plan Note (Signed)
Likely cyst vs lipoma.   -Will get neck ultrasound

## 2011-12-04 NOTE — Assessment & Plan Note (Signed)
Well controlled with liquid Prilosec -Will continue current regimen

## 2011-12-04 NOTE — Progress Notes (Signed)
  HPI: Kathy Horton is an 78 year old woman with PMH Of glaucoma, HTN, hypothyroidism, osteopenia, CVA in 2002, dysphagia presents for hospital follow up accompanied by her daughter in law who was recently admitted to hospital 3/5-3/8 for altered mental status likely 2/2 orthostatic hypotension which thought to be from acetozolamide for her glaucoma. She is here for routine follow up.   Constipation: also improve, Christy Gentles helps the most.  She was taking liquid Colace and she tolerated it well until the most recent refill/med when she started to have burning sensation and vomit after she takes this med so her daughter in law stopped liquid Colace.  She reports bowel movement on a daily basis.   Her mental status is almost back to baseline. She is oriented and alert. As for her neurologic status, she is able to move around with her walking roller and very active at home.  Dysphagia: much better, cooking and eating her own food now.  She can tolerate regular diet.  Cough: resolved with Prilosec. Reports significant improvement with her GERD as well.    Daughter in law noticed a left neck mass, nontender, not sure how long it has been there. Would like further imaging.  ROS: denies any fever, chills, abdominal pain, SOB or chest pain.   PE:  General: alert, thin appearing woman, and cooperative to examination. Neck: no thyromegaly. 3x3cm cyst/lipoma on left side of neck, nontender, no purulent discharge, non-eyrthematous, no induration Lungs: normal respiratory effort, no accessory muscle use, coarse breath sounds, no crackles, and no wheezes. Heart: normal rate, regular rhythm, no murmur, no gallop, and no rub.  Abdomen: soft, non-tender, normal bowel sounds, no distention, no guarding, no rebound tenderness  Pulses: 2+ DP/PT pulses bilaterally Extremities: No cyanosis, clubbing.  Neuro: nonfocal Psych: Alert &Oriented X3, pleasant

## 2011-12-04 NOTE — Assessment & Plan Note (Signed)
Adequately controlled. BP  141/69. Will watch for now and will not add another agent -Will continue Norvasc 10mg 

## 2011-12-04 NOTE — Assessment & Plan Note (Signed)
-  Will check TSH and free T4 today -Will adjust Synthroid accordingly once we have the results

## 2011-12-04 NOTE — Patient Instructions (Signed)
Continue current medications Will get labs today and I will call you with any abnormal results Will schedule for ultrasound of neck and we will call you with any abnormal results Follow up with Dr. Anselm Jungling in 6 months

## 2011-12-04 NOTE — Assessment & Plan Note (Signed)
Likely 2/2 constipation, but now improve with Activia -Will continue Activia -continue to treat her hypothyroidism -If continue to worsen, will try Dulcolax suppository since she cannot tolerate liquid Colace

## 2011-12-05 ENCOUNTER — Other Ambulatory Visit: Payer: Self-pay | Admitting: Internal Medicine

## 2011-12-05 MED ORDER — LEVOTHYROXINE SODIUM 25 MCG PO TABS: 37.5000 ug | ORAL_TABLET | Freq: Every day | ORAL | Status: DC

## 2011-12-10 ENCOUNTER — Ambulatory Visit (HOSPITAL_COMMUNITY)
Admission: RE | Admit: 2011-12-10 | Discharge: 2011-12-10 | Disposition: A | Discharge status: Home / Self Care | Payer: Medicare Other | Source: Ambulatory Visit | Attending: Internal Medicine | Admitting: Internal Medicine

## 2011-12-10 DIAGNOSIS — R220 Localized swelling, mass and lump, head: Secondary | ICD-10-CM | POA: Insufficient documentation

## 2011-12-10 DIAGNOSIS — R221 Localized swelling, mass and lump, neck: Secondary | ICD-10-CM | POA: Insufficient documentation

## 2011-12-11 ENCOUNTER — Encounter: Payer: Medicare Other | Admitting: Internal Medicine

## 2011-12-12 ENCOUNTER — Other Ambulatory Visit: Payer: Self-pay | Admitting: Internal Medicine

## 2011-12-12 DIAGNOSIS — R221 Localized swelling, mass and lump, neck: Secondary | ICD-10-CM

## 2012-01-14 ENCOUNTER — Ambulatory Visit
Admission: RE | Admit: 2012-01-14 | Discharge: 2012-01-14 | Disposition: A | Discharge status: Home / Self Care | Payer: Medicare Other | Source: Ambulatory Visit | Attending: Orthopedic Surgery | Admitting: Orthopedic Surgery

## 2012-01-14 ENCOUNTER — Other Ambulatory Visit: Payer: Self-pay | Admitting: Orthopedic Surgery

## 2012-01-14 DIAGNOSIS — M8580 Other specified disorders of bone density and structure, unspecified site: Secondary | ICD-10-CM

## 2012-01-14 DIAGNOSIS — I739 Peripheral vascular disease, unspecified: Secondary | ICD-10-CM

## 2012-02-04 ENCOUNTER — Other Ambulatory Visit: Payer: Self-pay | Admitting: Orthopedic Surgery

## 2012-02-04 DIAGNOSIS — I739 Peripheral vascular disease, unspecified: Secondary | ICD-10-CM

## 2012-02-06 ENCOUNTER — Other Ambulatory Visit: Payer: Medicare Other

## 2012-02-19 ENCOUNTER — Ambulatory Visit
Admission: RE | Admit: 2012-02-19 | Discharge: 2012-02-19 | Disposition: A | Discharge status: Home / Self Care | Payer: Medicare Other | Source: Ambulatory Visit | Attending: Orthopedic Surgery | Admitting: Orthopedic Surgery

## 2012-02-19 DIAGNOSIS — I739 Peripheral vascular disease, unspecified: Secondary | ICD-10-CM

## 2012-02-27 ENCOUNTER — Other Ambulatory Visit: Payer: Self-pay | Admitting: *Deleted

## 2012-02-29 MED ORDER — LEVOTHYROXINE SODIUM 25 MCG PO TABS: 37.5000 ug | ORAL_TABLET | Freq: Every day | ORAL | Status: DC

## 2012-03-05 ENCOUNTER — Other Ambulatory Visit (HOSPITAL_COMMUNITY): Payer: Medicare Other

## 2012-03-18 ENCOUNTER — Other Ambulatory Visit: Payer: Self-pay | Admitting: Internal Medicine

## 2012-04-01 ENCOUNTER — Encounter: Payer: Medicare Other | Admitting: Vascular Surgery

## 2012-04-08 ENCOUNTER — Encounter: Payer: Medicare Other | Admitting: Vascular Surgery

## 2012-04-11 ENCOUNTER — Encounter: Payer: Self-pay | Admitting: Vascular Surgery

## 2012-04-15 ENCOUNTER — Ambulatory Visit (INDEPENDENT_AMBULATORY_CARE_PROVIDER_SITE_OTHER): Payer: Medicare Other | Admitting: Vascular Surgery

## 2012-04-15 ENCOUNTER — Encounter: Payer: Self-pay | Admitting: Vascular Surgery

## 2012-04-15 VITALS — BP 138/67 | HR 69 | Resp 18 | Ht 65.0 in | Wt 90.0 lb

## 2012-04-15 DIAGNOSIS — M24569 Contracture, unspecified knee: Secondary | ICD-10-CM | POA: Insufficient documentation

## 2012-04-15 DIAGNOSIS — I739 Peripheral vascular disease, unspecified: Secondary | ICD-10-CM

## 2012-04-15 DIAGNOSIS — M79604 Pain in right leg: Secondary | ICD-10-CM

## 2012-04-15 DIAGNOSIS — M79609 Pain in unspecified limb: Secondary | ICD-10-CM

## 2012-04-15 DIAGNOSIS — M7989 Other specified soft tissue disorders: Secondary | ICD-10-CM | POA: Insufficient documentation

## 2012-04-15 NOTE — Progress Notes (Signed)
[facility] [place]   Patient name: Kathy Horton MRN: [id number] DOB: May 27, 1922 Sex: female   Referred by: Montez Morita  Reason for referral:  Chief Complaint  Patient presents with  . New Evaluation    NEW PVD  REFERRED BY DR Myrtie Neither    HISTORY OF PRESENT ILLNESS: The patient is an 78 year old female here today with her family. She has pain in both feet and ankles and is here today for evaluation of this. She reports that there is multiple components of this. She does report a tenderness and stiffness in her ankles attending extend out onto the instep. She does not have any specific pain extending out into her toes. She is able to walk with a walker but does not do a great deal of walking. She does not have any history of any tissue loss. He does have a remote history of stroke. She feels that the pain may be slightly more so in her right leg than her left leg.  Past Medical History  Diagnosis Date  . Glaucoma     recently started on acetazolamide   . Stroke 2002    2 strokes 1 month apart, has some speech deficit at basleine from this stroke and per the son patient has been slow  since then. Was taking aspirin before but stopped for past may years from unknown reasons.   Marland Kitchen HTN (hypertension)     not on any home medications  . Anemia     Past Surgical History  Procedure Date  . Right wrist   . Eye surgery   . Appendectomy   . Tonsillectomy     History   Social History  . Marital Status: Divorced    Spouse Name: N/A    Number of Children: N/A  . Years of Education: N/A   Occupational History  . Not on file.   Social History Main Topics  . Smoking status: Former Smoker -- 0.0 packs/day for 50 years    Types: Cigarettes    Quit date: 04/15/1989  . Smokeless tobacco: Never Used  . Alcohol Use: No  . Drug Use: No  . Sexually Active: No   Other Topics Concern  . Not on file   Social History Narrative   Lives alone. Has family  members who check on her every day. Is pretty functional at baseline per the son. She cooks her own food and is able to perform her activities of daily living. She does not go out of the house because she could not drive and was using bus before but has stopped using it for a long time. Never smoked cigarettes, never drank alcohol or any illicit drug use. Has [employer] Medicare.    Family History  Problem Relation Age of Onset  . Diabetes Mother   . Heart failure Mother   . Hypertension Mother   . Cancer Father     Allergies as of 04/15/2012 - Review Complete 04/15/2012  Allergen Reaction Noted  . Penicillins  10/16/2011    Current Outpatient Prescriptions on File Prior to Visit  Medication Sig Dispense Refill  . acetaminophen (TYLENOL) 325 MG tablet Take 2 tablets (650 mg total) by mouth every 6 (six) hours as needed for pain.      Marland Kitchen amLODipine (NORVASC) 10 MG tablet TAKE 1 TABLET (10 MG TOTAL) BY MOUTH DAILY.  30 tablet  3  . aspirin EC 81 MG EC tablet Take 1 tablet (81 mg total) by  mouth daily.      . brimonidine (ALPHAGAN P) 0.1 % SOLN Place 1 drop into the left eye every 12 (twelve) hours.      . dorzolamide-timolol (COSOPT) 22.3-6.8 MG/ML ophthalmic solution Place 1 drop into both eyes 2 (two) times daily.      . feeding supplement (ENSURE CLINICAL STRENGTH) LIQD Take 237 mLs by mouth 3 (three) times daily between meals.  100 Bottle  3  . levothyroxine (SYNTHROID, LEVOTHROID) 25 MCG tablet Take 1.5 tablets (37.5 mcg total) by mouth daily before breakfast.  60 tablet  3  . omeprazole (PRILOSEC) 2 mg/mL SUSP Take 20 mLs (40 mg total) by mouth daily at 12 noon.  600 mL  6  . Polyethyl Glycol-Propyl Glycol (SYSTANE ULTRA) 0.4-0.3 % SOLN Place 1 drop into both eyes 2 (two) times daily.      . Travoprost, BAK Free, (TRAVATAN) 0.004 % SOLN ophthalmic solution Place 1 drop into both eyes at bedtime.      Marland Kitchen DISCONTD: acetaZOLAMIDE (DIAMOX) 500 MG capsule Take 500 mg by mouth daily  after breakfast.         REVIEW OF SYSTEMS:  Positives indicated with an "X"  CARDIOVASCULAR:  [ ]  chest pain   [ ]  chest pressure   [ ]  palpitations   [ ]  orthopnea   [ ]  dyspnea on exertion   [ ]  claudication   [x ] rest pain   [ ]  DVT   [ ]  phlebitis PULMONARY:   [ ]  productive cough   [ ]  asthma   [ ]  wheezing NEUROLOGIC:   [x ] weakness  [ ]  paresthesias  [ ]  aphasia  [ ]  amaurosis  [ ]  dizziness HEMATOLOGIC:   [ ]  bleeding problems   [ ]  clotting disorders MUSCULOSKELETAL:  [ ]  joint pain   [ ]  joint swelling GASTROINTESTINAL: [ ]   blood in stool  [ ]   hematemesis GENITOURINARY:  [ ]   dysuria  [ ]   hematuria PSYCHIATRIC:  [ ]  history of major depression INTEGUMENTARY:  [ ]  rashes  [ ]  ulcers CONSTITUTIONAL:  [ ]  fever   [ ]  chills  PHYSICAL EXAMINATION:  General: The patient is a well-nourished female, in no acute distress. Vital signs are BP 138/67  Pulse 69  Resp 18  Ht 5\' 5"  (1.651 m)  Wt 90 lb (40.824 kg)  BMI 14.98 kg/m2 Pulmonary: There is a good air exchange bilaterally without wheezing or rales. Abdomen: Soft and non-tender with normal pitch bowel sounds. Musculoskeletal: There are no major deformities.  She does have some tenderness over her ankles bilaterally. There is no deformity. There is mild swelling in her calves.. Neurologic: No focal weakness or paresthesias are detected, Skin: There are no ulcer or rashes noted. Psychiatric: The patient has normal affect. Cardiovascular: There is a regular rate and rhythm without significant murmur appreciated. Pulse status: 2+ radial 2+ femoral pulses bilaterally she does have 1-2+ popliteal pulses bilaterally. She does not have palpable pedal pulses. Her feet are warm and well-perfused with no tissue loss. Carotid arteries without bruits bilaterally   Vascular Lab Studies:  She did undergo noninvasive vascular laboratory studies that [facility] imaging on 02/19/2012 I have these were reviewed. 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 I discussed this at length with the patient and her family present. I do not feel that her  symptoms that are causing her pain in her ankles extending onto her feet are related to arterial rest pain. She does not walk to the level of claudication. 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 I explained this is quite common with the advanced age and would not require specific treatment and would not be at risk and that she should develop tissue loss. Her reassured that this discussion and will continue followup with Dr. Montez Morita.    Weslyn Holsonback [facility] [place] Office: [phone]

## 2012-05-02 ENCOUNTER — Ambulatory Visit: Payer: Medicare Other | Admitting: Internal Medicine

## 2012-05-09 ENCOUNTER — Ambulatory Visit (INDEPENDENT_AMBULATORY_CARE_PROVIDER_SITE_OTHER): Payer: Medicare Other | Admitting: Internal Medicine

## 2012-05-09 VITALS — BP 131/59 | HR 70 | Temp 98.2°F | Wt 89.8 lb

## 2012-05-09 DIAGNOSIS — R220 Localized swelling, mass and lump, head: Secondary | ICD-10-CM

## 2012-05-09 DIAGNOSIS — I10 Essential (primary) hypertension: Secondary | ICD-10-CM

## 2012-05-09 DIAGNOSIS — K219 Gastro-esophageal reflux disease without esophagitis: Secondary | ICD-10-CM

## 2012-05-09 DIAGNOSIS — E039 Hypothyroidism, unspecified: Secondary | ICD-10-CM

## 2012-05-09 DIAGNOSIS — M7989 Other specified soft tissue disorders: Secondary | ICD-10-CM

## 2012-05-09 DIAGNOSIS — R221 Localized swelling, mass and lump, neck: Secondary | ICD-10-CM

## 2012-05-09 LAB — T4, FREE: Free T4: 0.93 ng/dL (ref 0.80–1.80)

## 2012-05-09 LAB — TSH: TSH: 2.625 u[IU]/mL (ref 0.350–4.500)

## 2012-05-09 MED ORDER — OMEPRAZOLE 40 MG PO CPDR: 40.0000 mg | DELAYED_RELEASE_CAPSULE | Freq: Every day | ORAL | Status: DC

## 2012-05-09 NOTE — Assessment & Plan Note (Addendum)
BP was 150/80's on arrival. Repeat BP was 131/59.  She is currently on Norvasc and Lasix.  Lasix was started by vascular surgeon. I am not sure what dose she is on.  This may explain her decrease in weight. -Will continue Norvasc and Lasix -Check BMP to make sure her Cr is ok

## 2012-05-09 NOTE — Assessment & Plan Note (Signed)
[place] of neck in 11/2011 showed .3 cm cystic lesion, abnormal/necrotic lymph node not excluded. -Will order CT neck with contrast for further evaluation

## 2012-05-09 NOTE — Assessment & Plan Note (Signed)
TSH in April was 7.1, she has been on Levothyroxine 37.80mcg qAM. -Will repeat TSH and free T4 today and will adjust her medication accordingly

## 2012-05-09 NOTE — Progress Notes (Signed)
HPI: Kathy Horton is an 78 yo W with PMH of CVA, hypothyroidism, HTN, GERD, constipation presents today for routine follow up.  In April , she had an ultrasound of her neck which showed a left cystic lesion ~4cm and CT of neck was ordered subsequently but patient canceled the test because she was having eye surgery.  She denies any dysphagia or odynophagia at this time.  She had dysphagia in the past after her CVA but that has resolved. She is worried because she has been losing weight, about 6 pounds since march 2013, but of note, she was also started on Lasix for her leg swelling.  Her son states that she has good appetite but sometimes poor.  Her constipation is improved. Still has dry skin.  No other complaints.  ROS: as per HPI  PE; General: alert, well-developed, and cooperative to examination.  Neck: left lateral aspect 4x4cm mobile cyst, nontender, no erythema or discharge.   Lungs: normal respiratory effort, no accessory muscle use, normal breath sounds, no crackles, and no wheezes. Heart: normal rate, regular rhythm, no murmur, no gallop, and no rub.  Abdomen: soft, non-tender, normal bowel sounds, no distention, no guarding, no rebound tenderness Msk: no joint swelling, no joint warmth, and no redness over joints.  Pulses: 2+ DP/PT pulses bilaterally Extremities: No cyanosis, clubbing, edema Neurologic: nonfocal

## 2012-05-09 NOTE — Assessment & Plan Note (Signed)
Stable.  She would like to switch from liquid Prilosec to tablet -Rx of Prilosec 40mg  qd was sent to [facility]

## 2012-05-09 NOTE — Patient Instructions (Addendum)
Will schedule for a CT scan of your neck I sent your prescription to [facility] Follow up with Dr. Anselm Jungling in 3 months

## 2012-05-10 LAB — BASIC METABOLIC PANEL WITH GFR
Calcium: 10 mg/dL (ref 8.4–10.5)
Chloride: 106 mEq/L (ref 96–112)
GFR, Est African American: 24 mL/min — ABNORMAL LOW
GFR, Est Non African American: 21 mL/min — ABNORMAL LOW
Glucose, Bld: 81 mg/dL (ref 70–99)
Potassium: 3.5 mEq/L (ref 3.5–5.3)
Sodium: 141 mEq/L (ref 135–145)

## 2012-05-13 ENCOUNTER — Encounter: Payer: Medicare Other | Admitting: Internal Medicine

## 2012-05-13 NOTE — Progress Notes (Signed)
Spoke with pt and her son.  He will make make sure pt gets plenty of fluids and will f/u on fri. 10/04. Criss Alvine, Darlene Cassady10/1/20133:28 PM

## 2012-05-14 ENCOUNTER — Ambulatory Visit: Payer: Medicare Other | Admitting: Internal Medicine

## 2012-05-16 ENCOUNTER — Encounter: Payer: Self-pay | Admitting: Internal Medicine

## 2012-05-16 ENCOUNTER — Ambulatory Visit (INDEPENDENT_AMBULATORY_CARE_PROVIDER_SITE_OTHER): Payer: Medicare Other | Admitting: Internal Medicine

## 2012-05-16 VITALS — BP 153/67 | HR 57 | Temp 98.2°F | Ht 65.0 in | Wt 89.6 lb

## 2012-05-16 DIAGNOSIS — I10 Essential (primary) hypertension: Secondary | ICD-10-CM

## 2012-05-16 DIAGNOSIS — R221 Localized swelling, mass and lump, neck: Secondary | ICD-10-CM

## 2012-05-16 DIAGNOSIS — R220 Localized swelling, mass and lump, head: Secondary | ICD-10-CM

## 2012-05-16 DIAGNOSIS — N179 Acute kidney failure, unspecified: Secondary | ICD-10-CM | POA: Insufficient documentation

## 2012-05-16 LAB — COMPLETE METABOLIC PANEL WITH GFR
ALT: 9 U/L (ref 0–35)
AST: 15 U/L (ref 0–37)
CO2: 23 mEq/L (ref 19–32)
Calcium: 10.3 mg/dL (ref 8.4–10.5)
Chloride: 102 mEq/L (ref 96–112)
Creat: 1.21 mg/dL — ABNORMAL HIGH (ref 0.50–1.10)
GFR, Est African American: 46 mL/min — ABNORMAL LOW
Potassium: 3.7 mEq/L (ref 3.5–5.3)
Sodium: 139 mEq/L (ref 135–145)
Total Protein: 8.6 g/dL — ABNORMAL HIGH (ref 6.0–8.3)

## 2012-05-16 MED ORDER — HYDRALAZINE HCL 25 MG PO TABS: 25.0000 mg | ORAL_TABLET | Freq: Two times a day (BID) | ORAL | Status: DC

## 2012-05-16 NOTE — Progress Notes (Signed)
HPI The patient is a 78 y.o. yo female with a history of a neck mass, prior stroke, and HTN, presenting for a follow-up of AKI.    The patient notes a history of a left-sided neck mass since 11/2011.  US showed a cystic lesion.  She was supposed to get a CT scan of the area, but delayed this due to eye surgery at the time.  Now, the patient has AKI, delaying the CT scan further, due to the desired use of contrast.  The patient believes the area is slowly getting bigger over time, but is not painful or erythematous.  At the patient's last visit, she was noted to have an elevation in her creatinine to 2, though to be due to volume depletion.  The patient has no history of CKD.  Of note, the patient was started on lasix in March 2013, which may have contributed to this.  Since that visit, the patient notes that she has been eating and drinking more, though she still notes urinating a lot during the day, which she attributes to lasix.  ROS: General: no fevers, chills, changes in weight, changes in appetite Skin: no rash HEENT: no blurry vision, hearing changes, sore throat Pulm: no dyspnea, coughing, wheezing CV: no chest pain, palpitations, shortness of breath Abd: no abdominal pain, nausea/vomiting, diarrhea/constipation GU: no dysuria, hematuria Ext: no arthralgias, myalgias Neuro: no weakness, numbness, or tingling  Filed Vitals:   05/16/12 1516  BP: 153/67  Pulse: 57  Temp: 98.2 F (36.8 C)    PEX General: alert, cooperative, and in no apparent distress HEENT: pupils equal round and reactive to light, vision grossly intact, oropharynx clear and non-erythematous  Neck: supple, no lymphadenopathy, large left-sided neck nodule, non-tender to palpation, no erythema Lungs: clear to ascultation bilaterally, normal work of respiration, no wheezes, rales, ronchi Heart: regular rate and rhythm, no murmurs, gallops, or rubs Abdomen: soft, non-tender, non-distended, normal bowel  sounds Extremities: no cyanosis, clubbing, or edema Neurologic: alert & oriented X3, cranial nerves II-XII intact, strength grossly intact, sensation intact to light touch  Current Outpatient Prescriptions on File Prior to Visit  Medication Sig Dispense Refill  . acetaminophen (TYLENOL) 325 MG tablet Take 2 tablets (650 mg total) by mouth every 6 (six) hours as needed for pain.      Marland Kitchen amLODipine (NORVASC) 10 MG tablet TAKE 1 TABLET (10 MG TOTAL) BY MOUTH DAILY.  30 tablet  3  . aspirin EC 81 MG EC tablet Take 1 tablet (81 mg total) by mouth daily.      . brimonidine (ALPHAGAN P) 0.1 % SOLN Place 1 drop into the left eye every 12 (twelve) hours.      . dorzolamide-timolol (COSOPT) 22.3-6.8 MG/ML ophthalmic solution Place 1 drop into both eyes 2 (two) times daily.      . feeding supplement (ENSURE CLINICAL STRENGTH) LIQD Take 237 mLs by mouth 3 (three) times daily between meals.  100 Bottle  3  . levothyroxine (SYNTHROID, LEVOTHROID) 25 MCG tablet Take 1.5 tablets (37.5 mcg total) by mouth daily before breakfast.  60 tablet  3  . omeprazole (PRILOSEC) 40 MG capsule Take 1 capsule (40 mg total) by mouth daily.  31 capsule  6  . Polyethyl Glycol-Propyl Glycol (SYSTANE ULTRA) 0.4-0.3 % SOLN Place 1 drop into both eyes 2 (two) times daily.      . Travoprost, BAK Free, (TRAVATAN) 0.004 % SOLN ophthalmic solution Place 1 drop into both eyes at bedtime.      Marland Kitchen  DISCONTD: acetaZOLAMIDE (DIAMOX) 500 MG capsule Take 500 mg by mouth daily after breakfast.        Assessment/Plan

## 2012-05-16 NOTE — Assessment & Plan Note (Signed)
The patient was noted to have an AKI on 9/27, thought to be secondary to prerenal azotemia, likely due to starting Lasix in March 2013. -recheck BMET today  Addendum - BMET shows Cr has decreased from 2 to 1.2 -stop lasix -repeat bmet in 2 weeks

## 2012-05-16 NOTE — Patient Instructions (Addendum)
We are checking your kidney function today, and will call you if the results are abnormal.  For your neck swelling, we are ordering a CT scan of your neck, though we cannot do this test until you know your kidney function is back to normal.  We will call you to set this up.  Your blood pressure was elevated today.  We will be adding another blood pressure pill, but we will wait to see the results of today's labs before doing so.  Please return for a follow-up visit in 2-4 months.

## 2012-05-16 NOTE — Assessment & Plan Note (Signed)
The patient's BP is elevated today.  Given her history of stroke, BP control is of high importance.  Patient already on CCB.  Shouldn't use BB (HR = 57 today) or diuretic (caused AKI).  I'll avoid ACE-I for now due to her AKI. -start hydralazine 25 mg BID

## 2012-05-16 NOTE — Assessment & Plan Note (Signed)
The patient is noted to have borderline high calcium and high protein.  After discussion with attending physician, decided on the following course of action, to be enacted at her lab draw in 2 weeks -SPEP, UPEP -PTH -repeat CMP

## 2012-05-16 NOTE — Assessment & Plan Note (Signed)
Relatively unchanged since prior visit -plan for CT neck with contrast once Creatinine returns to normal (hopefully at lab visit in 2 weeks)

## 2012-08-21 ENCOUNTER — Other Ambulatory Visit: Payer: Self-pay | Admitting: *Deleted

## 2012-08-22 MED ORDER — HYDRALAZINE HCL 25 MG PO TABS: 25.0000 mg | ORAL_TABLET | Freq: Two times a day (BID) | ORAL | Status: DC

## 2012-08-22 MED ORDER — AMLODIPINE BESYLATE 10 MG PO TABS: 10.0000 mg | ORAL_TABLET | Freq: Every day | ORAL | Status: DC

## 2012-09-16 ENCOUNTER — Ambulatory Visit (INDEPENDENT_AMBULATORY_CARE_PROVIDER_SITE_OTHER): Payer: Medicare Other | Admitting: Internal Medicine

## 2012-09-16 ENCOUNTER — Encounter: Payer: Self-pay | Admitting: Internal Medicine

## 2012-09-16 VITALS — BP 154/69 | HR 78 | Temp 97.4°F | Wt 87.5 lb

## 2012-09-16 DIAGNOSIS — N179 Acute kidney failure, unspecified: Secondary | ICD-10-CM

## 2012-09-16 DIAGNOSIS — K5900 Constipation, unspecified: Secondary | ICD-10-CM | POA: Insufficient documentation

## 2012-09-16 DIAGNOSIS — R221 Localized swelling, mass and lump, neck: Secondary | ICD-10-CM

## 2012-09-16 DIAGNOSIS — E039 Hypothyroidism, unspecified: Secondary | ICD-10-CM

## 2012-09-16 DIAGNOSIS — R1310 Dysphagia, unspecified: Secondary | ICD-10-CM

## 2012-09-16 DIAGNOSIS — I10 Essential (primary) hypertension: Secondary | ICD-10-CM

## 2012-09-16 LAB — BASIC METABOLIC PANEL WITH GFR
Calcium: 10.1 mg/dL (ref 8.4–10.5)
Chloride: 106 mEq/L (ref 96–112)
Creat: 0.96 mg/dL (ref 0.50–1.10)
GFR, Est African American: 61 mL/min
GFR, Est Non African American: 53 mL/min — ABNORMAL LOW

## 2012-09-16 LAB — TSH: TSH: 9.097 u[IU]/mL — ABNORMAL HIGH (ref 0.350–4.500)

## 2012-09-16 MED ORDER — DOCUSATE SODIUM 100 MG PO CAPS: 100.0000 mg | ORAL_CAPSULE | Freq: Two times a day (BID) | ORAL | Status: DC | PRN

## 2012-09-16 NOTE — Assessment & Plan Note (Signed)
Patient describes hard stool which is likely from the narcotic that she is taking for her osteoarthritis. -Will try Colace 100mg  po bid PRN

## 2012-09-16 NOTE — Patient Instructions (Addendum)
Will send to Gastroenterology -Dr. Matthias Hughs for further evaluation of your choking sensation Start stool softener Colace 100mg  one tablet daily but may take twice daily if your stool is still hard Will need to get CT scan of neck to evaluate cyst on your left neck Follow up in 3 months or sooner if needed

## 2012-09-16 NOTE — Assessment & Plan Note (Signed)
Cr was 1.2 in 05/2012 which may be due to prerenal.  Will repeat BMP today to make sure that her Cr is back to baseline (.9) before we send her for CT of neck to evaluate her left neck cyst.

## 2012-09-16 NOTE — Assessment & Plan Note (Signed)
Adequately controlled given new JNC8 guidelines. Will continue current regimen

## 2012-09-16 NOTE — Progress Notes (Signed)
Patient ID: Kathy Horton, female   DOB: [birth year], 79 y.o.   MRN: [id number] HPI: [name] is an 79 year old woman with PMH Of glaucoma, HTN, hypothyroidism, osteopenia, CVA in 2002, chronic dysphagia presents today for dysphagia. Her dysphagia improved for a while but now she is having choking sensation with solid food.  She chops up her food and eat small quantity at at time and has to chase it down with juice or water.  She has no difficulty with liquid and is able to swallow pills.  She denies any odynophagia or regurgitation.  She has been loosing weight because of not eating a lot.  She denies any GERD symptoms today.  She complains of hard stool and has to strain for bowel movement.  She is taking narcotic-oxycodone prescribed by her orthopedist for her osteoarthritis.  She has urinary incontinence and is wearing Depends.  Patient had a left neck cyst which was evaluated by ultrasound which showed a 4.3 cm cystic lesion, abnormal/necrotic lymph node not excluded. CT neck with contrast was suggested for further evaluation and she cancelled the appointment due to her eye surgery. Her son states that the cyst resolved but came back again.  She wants flu vaccine today.   ROS: as per HPI  PE: General: alert, cachetic appearing, and cooperative to examination.  Neck: 4 x 5cm nontender cyst on left neck. No other masses noted.   Lungs: normal respiratory effort, no accessory muscle use, normal breath sounds, no crackles, and no wheezes. Heart: normal rate, regular rhythm, no murmur, no gallop, and no rub.  Abdomen: soft, non-tender, normal bowel sounds, no distention, no guarding, no rebound tenderness Neurologic: nonfocal Skin: turgor normal and no rashes.  Psych: appropriate

## 2012-09-16 NOTE — Assessment & Plan Note (Signed)
Chronic problem, patient did have a CVA in 2002 of the posterior limb of left internal capsule.  Patient continues to have dysphagia with solid food. It was waxing and waning before and now it is coming back. She eats chopped food and has to chase it down with water otherwise she feels like choking. No odynophagia.She did have a modified swallow study in 10/2011 which showed a structural dysphagia with differential dx of esophageal phase based vs. Pharyngeal phase component (osteophytes, tight UES).  If she does have osteophytes then shaving it would be an option but given her age and co-morbidities, I do not think she would be a good surgical candidate.  After a long discussion with patient and her son, they would like to see GI to see if there is any other options to help with her dysphagia.  I also recommend eating mechanical soft food.   -Will refer to GI

## 2012-09-16 NOTE — Assessment & Plan Note (Signed)
Both patient and son would like to get CT neck to further evaluate her cyst.   -CT neck if Cr is back to baseline

## 2012-09-17 ENCOUNTER — Other Ambulatory Visit: Payer: Self-pay | Admitting: Internal Medicine

## 2012-09-17 MED ORDER — LEVOTHYROXINE SODIUM 50 MCG PO TABS: 50.0000 ug | ORAL_TABLET | Freq: Every day | ORAL | Status: DC

## 2012-09-25 ENCOUNTER — Ambulatory Visit: Payer: Medicare Other | Admitting: Internal Medicine

## 2012-10-02 ENCOUNTER — Ambulatory Visit: Payer: Medicare Other | Admitting: Internal Medicine

## 2012-10-10 ENCOUNTER — Ambulatory Visit (INDEPENDENT_AMBULATORY_CARE_PROVIDER_SITE_OTHER): Payer: Medicaid Other | Admitting: Internal Medicine

## 2012-10-10 ENCOUNTER — Encounter: Payer: Self-pay | Admitting: Internal Medicine

## 2012-10-10 VITALS — BP 121/59 | HR 84 | Temp 97.3°F | Ht 60.0 in | Wt 87.8 lb

## 2012-10-10 DIAGNOSIS — K5900 Constipation, unspecified: Secondary | ICD-10-CM

## 2012-10-10 DIAGNOSIS — R221 Localized swelling, mass and lump, neck: Secondary | ICD-10-CM

## 2012-10-10 DIAGNOSIS — E039 Hypothyroidism, unspecified: Secondary | ICD-10-CM

## 2012-10-10 DIAGNOSIS — M79604 Pain in right leg: Secondary | ICD-10-CM

## 2012-10-10 DIAGNOSIS — K219 Gastro-esophageal reflux disease without esophagitis: Secondary | ICD-10-CM

## 2012-10-10 DIAGNOSIS — M79609 Pain in unspecified limb: Secondary | ICD-10-CM

## 2012-10-10 MED ORDER — DICLOFENAC SODIUM 1 % TD GEL: 2.0000 g | Freq: Four times a day (QID) | TRANSDERMAL | Status: DC

## 2012-10-10 MED ORDER — ACETAMINOPHEN ER 650 MG PO TBCR: 650.0000 mg | EXTENDED_RELEASE_TABLET | Freq: Three times a day (TID) | ORAL | Status: DC | PRN

## 2012-10-10 MED ORDER — POLYETHYLENE GLYCOL 3350 17 G PO PACK: 17.0000 g | PACK | Freq: Every day | ORAL | Status: AC

## 2012-10-10 MED ORDER — OMEPRAZOLE 40 MG PO CPDR: 40.0000 mg | DELAYED_RELEASE_CAPSULE | Freq: Two times a day (BID) | ORAL | Status: DC

## 2012-10-10 NOTE — Assessment & Plan Note (Signed)
TSH 9.09, free T4 0.78 on 09/16/2012. Patient was instructed to increase levothyroxin from 37.5 to 50 MCG.  Will continue levothyroxin 50 mcg every morning. Since we have just change her dosage about 3 weeks ago, I will repeat her TSH and free T4 in one month

## 2012-10-10 NOTE — Assessment & Plan Note (Signed)
Currently on Colace but not well-controlled. Patient still has straining during bowel movements. Will try MiraLAX daily in addition to her Colace

## 2012-10-10 NOTE — Assessment & Plan Note (Signed)
Bilateral feet pain likely osteoarthritis. -Will try Voltaren gel 4 times a day when necessary -She may also take Tylenol as needed at bedtime for pain

## 2012-10-10 NOTE — Assessment & Plan Note (Signed)
Given uncontrolled symptom, will try omeprazole 40 mg by mouth twice a day, 30 minutes before meal.  Also refer patient to GI but she has not seen the specialist.

## 2012-10-10 NOTE — Assessment & Plan Note (Addendum)
Will schedule for CT neck

## 2012-10-10 NOTE — Progress Notes (Signed)
Patient ID: Kathy Horton, female   DOB: 02-20-1922, 79 y.o.   MRN: [id number] History of present illness: [name] is an 78 year old woman with past medical history of glaucoma, HTN, hypothyroidism, osteopenia, CVA in 2002, chronic dysphagia presents today for abdominal pain, straining when having bowel movement. STool is soft.  She takes omeprazole 40 mg daily as well as X. Gas.  She was prescribed Colace during last office visit which helps a little. Today she also complained of bilateral ankle pain and feet pain which has been going on for a while. Her son states that she's not you have to sleep at night and that she does have osteoarthritis in her ankles. She tried rubbing some cream over-the-counter on her ankles but not really working. As for the cyst on her left neck, patient has not been able to get her CT scan and states that we have not made the appointment. As for her thyroid, she has been taking the increased dose of levothyroxine a 50 mcg.     Review of system: As per history of present illness  PE:  General: alert, cachetic appearing, and cooperative to examination.  Neck: 4 x 5cm nontender cyst on left neck. No other masses noted.  Lungs: normal respiratory effort, no accessory muscle use, normal breath sounds, no crackles, and no wheezes. Heart: normal rate, regular rhythm, no murmur, no gallop, and no rub.  Abdomen: soft, non-tender, normal bowel sounds, no distention, no guarding, no rebound tenderness Neurologic: nonfocal Skin: turgor normal and dry skin.  Extremities: Limited range of motion of both ankles, +1 pitting edema up to her knees. No increase in warmth or erythema.  Psych: appropriate

## 2012-10-10 NOTE — Patient Instructions (Addendum)
Increase omeprazole to 40 mg 2 times daily, take it 30 minutes before meal Start MiraLAX 17 g daily for constipation Will schedule for CT scan of the neck  You can take Tylenol as needed for pain You can rub Voltaren Gel on your feet 4 times daily as needed for pain Follow up in 1 month

## 2012-10-14 ENCOUNTER — Ambulatory Visit: Payer: Medicare Other | Admitting: Internal Medicine

## 2012-10-22 ENCOUNTER — Telehealth: Payer: Self-pay | Admitting: *Deleted

## 2012-10-22 NOTE — Telephone Encounter (Signed)
SPOKE WITH PATIENT, GAVE HER CAT SCAN APPT. ([facility] 3-14-014 @ 11:00AM-ARRIVE 10:45AM). PATIENT STATES SHE WILL NOT BE ABLE TO KEEP THIS APP. IT MIGHT BE THE END OF THE MONTH OR THE FIRST OF NEXT MONTH BEFORE SHE CAN GET THIS DONE. DUE TO TRANSPORTATION. SHE DEPENDS ON HER DAUGHTER TO TAKE HER TO APPOINTMENT. SON JUST STARTED NEW JOB, HE CAN'T TAKE HER.  WILL LET DR.  Ashley Mariner.  Kathy Horton NTII 3-12-014  12:30PM

## 2012-10-24 ENCOUNTER — Ambulatory Visit (HOSPITAL_COMMUNITY): Payer: Medicare Other

## 2012-10-30 ENCOUNTER — Encounter: Payer: Self-pay | Admitting: Licensed Clinical Social Worker

## 2012-11-18 ENCOUNTER — Encounter: Payer: Self-pay | Admitting: Internal Medicine

## 2012-11-18 DIAGNOSIS — R269 Unspecified abnormalities of gait and mobility: Secondary | ICD-10-CM | POA: Insufficient documentation

## 2012-11-19 ENCOUNTER — Ambulatory Visit (HOSPITAL_COMMUNITY): Admission: RE | Admit: 2012-11-19 | Payer: Medicare Other | Source: Ambulatory Visit

## 2012-12-29 ENCOUNTER — Other Ambulatory Visit: Payer: Self-pay | Admitting: *Deleted

## 2012-12-30 MED ORDER — HYDRALAZINE HCL 25 MG PO TABS: 25.0000 mg | ORAL_TABLET | Freq: Two times a day (BID) | ORAL | Status: DC

## 2013-01-26 ENCOUNTER — Other Ambulatory Visit: Payer: Self-pay | Admitting: Internal Medicine

## 2013-01-26 NOTE — Telephone Encounter (Signed)
Please schedule an appointment within 1 month for follow-up of her thyroid issues.

## 2013-02-02 ENCOUNTER — Encounter: Payer: Self-pay | Admitting: Internal Medicine

## 2013-02-11 ENCOUNTER — Encounter: Payer: Self-pay | Admitting: Licensed Clinical Social Worker

## 2013-02-11 NOTE — Progress Notes (Deleted)
Patient ID: Kathy Horton, female   DOB: [birth year], 79 y.o.   MRN: [id number] Pt has yet to return the [place] [employer] change of PCP form.  CSW will provide to nursing for pt's upcoming appt.  Will request pt to sign change of PCP form during next [facility] appt.

## 2013-02-11 NOTE — Progress Notes (Signed)
CSW placed call to Ms. Stoltzfus to discuss completing the [place] [employer] Enrollment Form to change PCP from [facility] to [facility].  CSW explained to Ms. Janee Morn, [facility] unable to make any referrals until [facility] is listed as PCP on medicaid card.  Pt states she does not want to change from [facility] because the physician has resolved her eye issues and want to keep him on her card.  CSW attempted to explain, we are only changing PCP no change regarding pt seeing an eye specialist.  Pt unable to grasp the understanding over the telephone.  CSW will request physician/staff explain to pt during [facility] appt, as appt is outside of CSW hours.

## 2013-02-24 ENCOUNTER — Encounter: Payer: Medicare Other | Admitting: Internal Medicine

## 2013-03-11 ENCOUNTER — Ambulatory Visit: Payer: Medicare Other | Admitting: Internal Medicine

## 2013-04-15 ENCOUNTER — Other Ambulatory Visit: Payer: Self-pay | Admitting: Internal Medicine

## 2013-05-15 ENCOUNTER — Encounter: Payer: Self-pay | Admitting: Internal Medicine

## 2013-05-15 NOTE — Progress Notes (Signed)
Patient ID: Kathy Horton, female   DOB: [birth year], 79 y.o.   MRN: [id number]  This patient has been identified as a high fall risk and is a female that is 65+.  Please consider ordering a DEXA scan to screen for osteoporosis.

## 2013-05-18 ENCOUNTER — Other Ambulatory Visit: Payer: Self-pay | Admitting: *Deleted

## 2013-06-02 ENCOUNTER — Encounter: Payer: Self-pay | Admitting: Internal Medicine

## 2013-06-02 ENCOUNTER — Ambulatory Visit (INDEPENDENT_AMBULATORY_CARE_PROVIDER_SITE_OTHER): Payer: PRIVATE HEALTH INSURANCE | Admitting: Internal Medicine

## 2013-06-02 VITALS — BP 149/70 | HR 69 | Temp 96.8°F | Wt 89.1 lb

## 2013-06-02 DIAGNOSIS — H6123 Impacted cerumen, bilateral: Secondary | ICD-10-CM

## 2013-06-02 DIAGNOSIS — R221 Localized swelling, mass and lump, neck: Secondary | ICD-10-CM

## 2013-06-02 DIAGNOSIS — I10 Essential (primary) hypertension: Secondary | ICD-10-CM

## 2013-06-02 DIAGNOSIS — R269 Unspecified abnormalities of gait and mobility: Secondary | ICD-10-CM

## 2013-06-02 DIAGNOSIS — H6120 Impacted cerumen, unspecified ear: Secondary | ICD-10-CM

## 2013-06-02 DIAGNOSIS — E039 Hypothyroidism, unspecified: Secondary | ICD-10-CM

## 2013-06-02 DIAGNOSIS — R220 Localized swelling, mass and lump, head: Secondary | ICD-10-CM

## 2013-06-02 DIAGNOSIS — D649 Anemia, unspecified: Secondary | ICD-10-CM

## 2013-06-02 DIAGNOSIS — R1310 Dysphagia, unspecified: Secondary | ICD-10-CM

## 2013-06-02 LAB — CBC WITH DIFFERENTIAL/PLATELET
Basophils Absolute: 0 10*3/uL (ref 0.0–0.1)
Eosinophils Absolute: 0 10*3/uL (ref 0.0–0.7)
Lymphocytes Relative: 16 % (ref 12–46)
Lymphs Abs: 0.9 10*3/uL (ref 0.7–4.0)
Neutrophils Relative %: 72 % (ref 43–77)
Platelets: 369 10*3/uL (ref 150–400)
RBC: 3.96 MIL/uL (ref 3.87–5.11)
WBC: 5.4 10*3/uL (ref 4.0–10.5)

## 2013-06-02 NOTE — Progress Notes (Addendum)
Subjective:   Patient ID: Kathy Horton female   DOB: November 02, 1921 79 y.o.   MRN: [id number]  HPI: KathyKathy Horton is a 79 y.o. African American female with PMH of HTN, glaucoma, hypothyroidism, osteopenia, CVA in 2002, and chronic dysphagia presenting to opc today for routine follow up visit.  Her son is present in the room with her as well and is her primary care taker.  They were last seen by Dr. Anselm Jungling in 09/2012 and at that time were supposed to follow up with GI for chronic dysphagia and also get a CT of neck for her neck mass.  This was apparently not done due to insurance card issues and provider listed.  We have tried to rectify that situation today by providing the son with the necessary paperwork.  We did also proceed with ordering the CT but will wait for GI and speech therapy referral until able to proceed.    Kathy Horton is very pleasant, she refuses to get a flu vaccine today claiming she hates needles but says she usually gets it at [facility] or her apartment.  She will reconsider getting it later.  She does report dysphagia and having to chop up her food in small pieces to swallow and that sometimes the food gets stuck.  She says this has been going on for several years.    In regards to her neck mass, she has a ~3-4cm mobile soft non-tender left neck mass that has previously been identified on ultrasound of neck to be probably cystic lesion but recommended for CT neck for further evaluation.  This was last done in 11/2011, and thus we will proceed with CT neck at this time.  Her weight has been relatively stable since 10/2011 but she does weight <100lbs.   Finally, in regards to her medications, she is complaint with her BP meds but she has not been taking her levothyroxine per the son since he says her urgent care provider discontinued it a while ago. We will need to recheck levels today  Past Medical History  Diagnosis Date  . Glaucoma     recently started on acetazolamide   .  Stroke 2002    2 strokes 1 month apart, has some speech deficit at [place] from this stroke and per the son patient has been slow  since then. Was taking aspirin before but stopped for past may years from unknown reasons.   Marland Kitchen HTN (hypertension)     not on any home medications  . Anemia    Current Outpatient Prescriptions  Medication Sig Dispense Refill  . acetaminophen (TYLENOL 8 HOUR) 650 MG CR tablet Take 1 tablet (650 mg total) by mouth every 8 (eight) hours as needed for pain.  45 tablet  3  . amLODipine (NORVASC) 10 MG tablet TAKE 1 TABLET (10 MG TOTAL) BY MOUTH DAILY.  30 tablet  6  . brimonidine (ALPHAGAN P) 0.1 % SOLN Place 1 drop into the left eye every 12 (twelve) hours.      . celecoxib (CELEBREX) 100 MG capsule Take 100 mg by mouth daily.      . diclofenac sodium (VOLTAREN) 1 % GEL Apply 2 g topically 4 (four) times daily.  1 Tube  6  . docusate sodium (COLACE) 100 MG capsule Take 1 capsule (100 mg total) by mouth 2 (two) times daily as needed for constipation.  60 capsule  3  . dorzolamide-timolol (COSOPT) 22.3-6.8 MG/ML ophthalmic solution Place 1 drop into both eyes  2 (two) times daily.      . feeding supplement (ENSURE CLINICAL STRENGTH) LIQD Take 237 mLs by mouth 3 (three) times daily between meals.  100 Bottle  3  . hydrALAZINE (APRESOLINE) 25 MG tablet Take 1 tablet (25 mg total) by mouth 2 (two) times daily.  60 tablet  3  . omeprazole (PRILOSEC) 40 MG capsule Take 1 capsule (40 mg total) by mouth 2 (two) times daily.  62 capsule  6  . Polyethyl Glycol-Propyl Glycol (SYSTANE ULTRA) 0.4-0.3 % SOLN Place 1 drop into both eyes 2 (two) times daily.      . polyethylene glycol (MIRALAX / GLYCOLAX) packet Take 17 g by mouth daily.  30 each  6  . Travoprost, BAK Free, (TRAVATAN) 0.004 % SOLN ophthalmic solution Place 1 drop into both eyes at bedtime.      Marland Kitchen levothyroxine (SYNTHROID, LEVOTHROID) 50 MCG tablet TAKE 1 TABLET (50 MCG TOTAL) BY MOUTH DAILY BEFORE BREAKFAST.  30 tablet  1   . [DISCONTINUED] acetaZOLAMIDE (DIAMOX) 500 MG capsule Take 500 mg by mouth daily after breakfast.       No current facility-administered medications for this visit.   Family History  Problem Relation Age of Onset  . Diabetes Mother   . Heart failure Mother   . Hypertension Mother   . Cancer Father    History   Social History  . Marital Status: Divorced    Spouse Name: N/A    Number of Children: N/A  . Years of Education: N/A   Social History Main Topics  . Smoking status: Former Smoker -- 0.00 packs/day for 50 years    Types: Cigarettes    Quit date: 04/15/1989  . Smokeless tobacco: Never Used  . Alcohol Use: No  . Drug Use: No  . Sexual Activity: No   Other Topics Concern  . None   Social History Narrative   Lives alone. Has family members who check on her every day. Is pretty functional at baseline per the son. She cooks her own food and is able to perform her activities of daily living. She does not go out of the house because she could not drive and was using bus before but has stopped using it for a long time. Never smoked cigarettes, never drank alcohol or any illicit drug use. Has [employer] Medicare.   Review of Systems:  Constitutional:  Decreased appetite and fatigue.  Denies fever, chills, diaphoresis  HEENT:  Cerumen b/l impaction, left sided neck mass, trouble swallowing.   Respiratory:  Denies SOB, DOE, cough, and wheezing.   Cardiovascular:  Denies chest pain, palpitations, and leg swelling.   Gastrointestinal:  Abdominal pain and constipation.    Genitourinary:  Denies dysuria.  Wearing a diaper.    Musculoskeletal:  Diffuse joint pain  Skin:  Denies pallor, rash and wound.   Neurological:  Weakness.  Denies dizziness, syncope, light-headedness, numbness and headaches.    Objective:  Physical Exam: Filed Vitals:   06/02/13 1505  BP: 149/70  Pulse: 69  Temp: 96.8 F (36 C)  TempSrc: Oral  Weight: 89 lb 1.6 oz (40.415 kg)  SpO2: 98%    Vitals reviewed. General: sitting in chair, NAD, has walker in front of her HEENT: PERRL, EOMI, b/l ear cerumen impaction  Cardiac: RRR Pulm: clear to auscultation bilaterally Abd: soft, mild tenderness to deep palpation in diffuse abdomen tender, BS present  Ext: warm and well perfused, no pedal edema, +2DP B/L Neuro: alert and oriented  X3, cranial nerves II-XII grossly intact, strength and sensation to light touch equal in bilateral upper and lower extremities  Assessment & Plan:  Discussed with Dr. Aundria Rud CT neck Consider speech therapy follow up Tsh, t4, bmet, cbc

## 2013-06-02 NOTE — Assessment & Plan Note (Signed)
Uses walker, no reported recent falls per son and patient.

## 2013-06-02 NOTE — Assessment & Plan Note (Addendum)
Has been uncontrolled in the past.  Son claims prior provider stopped levothyroxine and she is no longer taking it.    -will recheck tsh and t4 and adjust dose.  Per 09/2012 was supposed to be on levothyroxin  Addendum 06/16/13 -TSH up to 7.638 but fT4 1.02 -discussed with Dr. Dalphine Handing, given hx of hypothyroid and unclear why stopped and also the 3 mm lesion on CT chest, will restart levothyroxine at this time and follow up levels in 6 weeks -will start low dose, ~ <71mcg/kg/day per geriatric dosing--will start with 37.5 mcg qd and titrate up as needed -will also try to discuss advised follow up with endocrinology in regards to thyroid lesion -discussed plan with son who asked for prescription to be sent to pharmacy

## 2013-06-02 NOTE — Assessment & Plan Note (Signed)
Plan per Dr. Christie Nottingham last assessment, however, GI referral never processed due to insurance card issues with wrong provider listed.  Provided son and patient with new paperwork today, once resolved, will process referral if she still wishes.   -may benefit from speech therapy and will also try to discuss with GI if referral appropriate at this time.

## 2013-06-02 NOTE — Patient Instructions (Signed)
General Instructions: Please get your CT neck done and try to correct the insurance card so we can return to speech therapy  Please reconsider getting your flu vaccine  If you notice worsening swallowing or trouble breathing please let us know right away   Treatment Goals:  Goals (1 Years of Data) as of 06/02/13     Lifestyle    . Prevent Falls       Progress Toward Treatment Goals:  No flowsheet data found.  Self Care Goals & Plans:  No flowsheet data found.  No flowsheet data found.   Care Management & Community Referrals:  No flowsheet data found.

## 2013-06-02 NOTE — Assessment & Plan Note (Signed)
BP Readings from Last 3 Encounters:  06/02/13 149/70  10/10/12 121/59  09/16/12 154/69   Lab Results  Component Value Date   NA 140 09/16/2012   K 4.1 09/16/2012   CREATININE 0.96 09/16/2012   Assessment: Blood pressure control: controlled  Plan: Medications:  continue current medications norvasc 10 qd and hydralazine 25mg  bid

## 2013-06-02 NOTE — Assessment & Plan Note (Addendum)
CT neck never done in 09/2012  -re-ordered today -check bmet for renal function  Addendum: 06/16/13  CT neck soft tissue neck 06/11/13:  IMPRESSION:   Right lobe of thyroid Low-density 2.3 x 4.6 x 5 cm mass posterior to the left  submandibular gland, deep to the left sternocleidomastoid muscle  appears to have increased in size from the prior ultrasound when  this measured 1.8 x 3.5 x 4.3 cm. No peripheral nodular enhancement.  Although it is possible this represents a branchial cleft cyst, in  an adult, one has to assume that this may represent a necrotic lymph  node from primary neck malignancy which is not detected on the  present exam.  3 mm lesion  -will refer to ENT for further management, discussed with Dr. Dalphine Handing as well -will also try to discuss with endocrinology in regards to follow up of lesion -discussed with her son, Mr. Shon Baton on the phone today (Ms. Eustice gave him the phone to speak with me). He agrees with proceeding with ENT referral

## 2013-06-03 LAB — COMPLETE METABOLIC PANEL WITH GFR
ALT: <8 U/L (ref 0–35)
AST: 15 U/L (ref 0–37)
Albumin: 4.4 g/dL (ref 3.5–5.2)
Alkaline Phosphatase: 34 U/L — ABNORMAL LOW (ref 39–117)
BUN: 20 mg/dL (ref 6–23)
CO2: 26 mEq/L (ref 19–32)
Calcium: 10.3 mg/dL (ref 8.4–10.5)
Chloride: 104 mEq/L (ref 96–112)
Creat: 0.94 mg/dL (ref 0.50–1.10)
GFR, Est African American: 62 mL/min
GFR, Est Non African American: 54 mL/min — ABNORMAL LOW
Glucose, Bld: 89 mg/dL (ref 70–99)
Potassium: 4.4 mEq/L (ref 3.5–5.3)
Sodium: 139 mEq/L (ref 135–145)
Total Bilirubin: 0.5 mg/dL (ref 0.3–1.2)
Total Protein: 7.7 g/dL (ref 6.0–8.3)

## 2013-06-03 LAB — T4, FREE: Free T4: 1.02 ng/dL (ref 0.80–1.80)

## 2013-06-10 ENCOUNTER — Other Ambulatory Visit: Payer: Self-pay | Admitting: *Deleted

## 2013-06-11 ENCOUNTER — Ambulatory Visit (HOSPITAL_COMMUNITY)
Admission: RE | Admit: 2013-06-11 | Discharge: 2013-06-11 | Disposition: A | Discharge status: Home / Self Care | Payer: PRIVATE HEALTH INSURANCE | Source: Ambulatory Visit | Attending: Internal Medicine | Admitting: Internal Medicine

## 2013-06-11 ENCOUNTER — Encounter (HOSPITAL_COMMUNITY): Payer: Self-pay

## 2013-06-11 DIAGNOSIS — I658 Occlusion and stenosis of other precerebral arteries: Secondary | ICD-10-CM | POA: Insufficient documentation

## 2013-06-11 DIAGNOSIS — R221 Localized swelling, mass and lump, neck: Secondary | ICD-10-CM

## 2013-06-11 DIAGNOSIS — R220 Localized swelling, mass and lump, head: Secondary | ICD-10-CM | POA: Insufficient documentation

## 2013-06-11 DIAGNOSIS — I700 Atherosclerosis of aorta: Secondary | ICD-10-CM | POA: Insufficient documentation

## 2013-06-11 DIAGNOSIS — M4802 Spinal stenosis, cervical region: Secondary | ICD-10-CM | POA: Insufficient documentation

## 2013-06-11 DIAGNOSIS — E0789 Other specified disorders of thyroid: Secondary | ICD-10-CM | POA: Insufficient documentation

## 2013-06-11 MED ORDER — HYDRALAZINE HCL 25 MG PO TABS: 25.0000 mg | ORAL_TABLET | Freq: Two times a day (BID) | ORAL | Status: DC

## 2013-06-11 MED ORDER — IOHEXOL 300 MG/ML  SOLN
80.0000 mL | Freq: Once | INTRAMUSCULAR | Status: AC | PRN
  Administered 2013-06-11: 80 mL via INTRAVENOUS

## 2013-06-16 DIAGNOSIS — H6120 Impacted cerumen, unspecified ear: Secondary | ICD-10-CM | POA: Insufficient documentation

## 2013-06-16 MED ORDER — CARBAMIDE PEROXIDE 6.5 % OT SOLN: OTIC | Status: AC

## 2013-06-16 MED ORDER — LEVOTHYROXINE SODIUM 25 MCG PO TABS: 40.0000 ug | ORAL_TABLET | Freq: Every day | ORAL | Status: DC

## 2013-06-16 NOTE — Assessment & Plan Note (Signed)
B/l ears. Tried OTC management for ear wax but son reports no improvement. Will prescribe debrox at this time. Also is being referred to ENT for neck mass.  -debrox: Ear wax removal: Otic: Tilt head sideways and instill 5-10 drops twice daily up to 4 days, tip of applicator should not enter ear canal; keep drops in ear for several minutes by keeping head tilted and placing cotton in ear  -follow up ENT

## 2013-06-16 NOTE — Addendum Note (Signed)
Addended by: Baltazar Apo on: 06/16/2013 06:07 PM   Modules accepted: Orders

## 2013-06-17 NOTE — Progress Notes (Signed)
Dr. Virgina Organ discussed some aspects of the assessment and plan of the case with me on 06/16/13.

## 2013-06-18 ENCOUNTER — Telehealth: Payer: Self-pay

## 2013-06-25 NOTE — Progress Notes (Signed)
Case discussed with Dr. Qureshi soon after the resident saw the patient.  We reviewed the resident's history and exam and pertinent patient test results.  I agree with the assessment, diagnosis, and plan of care documented in the resident's note. 

## 2013-07-06 ENCOUNTER — Other Ambulatory Visit: Payer: Self-pay | Admitting: *Deleted

## 2013-07-06 NOTE — Telephone Encounter (Signed)
Is she still taking this tylenol? Seems like it was not meant to be a continuous prescription.

## 2013-07-07 ENCOUNTER — Other Ambulatory Visit: Payer: Self-pay | Admitting: *Deleted

## 2013-07-07 NOTE — Telephone Encounter (Signed)
If she has a full bottle, we will not refill at this time. She can call closer to her current prescription filling for refill/re-evaluation.

## 2013-07-07 NOTE — Telephone Encounter (Signed)
review 

## 2013-07-07 NOTE — Telephone Encounter (Signed)
Pt called and she is still taking tylenol, but she also said she had a full bottle at this time.

## 2013-07-08 NOTE — Telephone Encounter (Signed)
ok 

## 2013-07-25 ENCOUNTER — Other Ambulatory Visit: Payer: Self-pay | Admitting: Internal Medicine

## 2013-08-24 ENCOUNTER — Other Ambulatory Visit: Payer: Self-pay | Admitting: *Deleted

## 2013-08-24 DIAGNOSIS — K219 Gastro-esophageal reflux disease without esophagitis: Secondary | ICD-10-CM

## 2013-08-24 MED ORDER — OMEPRAZOLE 40 MG PO CPDR: 40.0000 mg | DELAYED_RELEASE_CAPSULE | Freq: Every day | ORAL | Status: DC

## 2013-09-01 ENCOUNTER — Encounter: Payer: Medicare Other | Admitting: Internal Medicine

## 2013-09-08 ENCOUNTER — Ambulatory Visit (INDEPENDENT_AMBULATORY_CARE_PROVIDER_SITE_OTHER): Payer: PRIVATE HEALTH INSURANCE | Admitting: Internal Medicine

## 2013-09-08 ENCOUNTER — Encounter: Payer: Self-pay | Admitting: Internal Medicine

## 2013-09-08 ENCOUNTER — Ambulatory Visit (HOSPITAL_COMMUNITY)
Admission: RE | Admit: 2013-09-08 | Discharge: 2013-09-08 | Disposition: A | Discharge status: Home / Self Care | Payer: PRIVATE HEALTH INSURANCE | Source: Ambulatory Visit | Attending: Internal Medicine | Admitting: Internal Medicine

## 2013-09-08 VITALS — BP 147/71 | HR 73 | Temp 99.0°F | Wt 91.7 lb

## 2013-09-08 DIAGNOSIS — W19XXXA Unspecified fall, initial encounter: Secondary | ICD-10-CM

## 2013-09-08 DIAGNOSIS — M899 Disorder of bone, unspecified: Secondary | ICD-10-CM | POA: Insufficient documentation

## 2013-09-08 DIAGNOSIS — R220 Localized swelling, mass and lump, head: Secondary | ICD-10-CM

## 2013-09-08 DIAGNOSIS — R1310 Dysphagia, unspecified: Secondary | ICD-10-CM

## 2013-09-08 DIAGNOSIS — Y92009 Unspecified place in unspecified non-institutional (private) residence as the place of occurrence of the external cause: Secondary | ICD-10-CM

## 2013-09-08 DIAGNOSIS — I10 Essential (primary) hypertension: Secondary | ICD-10-CM

## 2013-09-08 DIAGNOSIS — M949 Disorder of cartilage, unspecified: Secondary | ICD-10-CM

## 2013-09-08 DIAGNOSIS — K219 Gastro-esophageal reflux disease without esophagitis: Secondary | ICD-10-CM

## 2013-09-08 DIAGNOSIS — R221 Localized swelling, mass and lump, neck: Secondary | ICD-10-CM

## 2013-09-08 DIAGNOSIS — I739 Peripheral vascular disease, unspecified: Secondary | ICD-10-CM | POA: Insufficient documentation

## 2013-09-08 DIAGNOSIS — M259 Joint disorder, unspecified: Secondary | ICD-10-CM | POA: Insufficient documentation

## 2013-09-08 DIAGNOSIS — E039 Hypothyroidism, unspecified: Secondary | ICD-10-CM

## 2013-09-08 MED ORDER — CELECOXIB 100 MG PO CAPS: 100.0000 mg | ORAL_CAPSULE | Freq: Two times a day (BID) | ORAL | Status: DC

## 2013-09-08 MED ORDER — DOCUSATE SODIUM 100 MG PO CAPS: 100.0000 mg | ORAL_CAPSULE | Freq: Two times a day (BID) | ORAL | Status: AC | PRN

## 2013-09-08 MED ORDER — AMLODIPINE BESYLATE 10 MG PO TABS
ORAL_TABLET | ORAL | Status: DC
Start: 2013-09-08 — End: 2014-08-11

## 2013-09-08 MED ORDER — CELECOXIB 100 MG PO CAPS: 100.0000 mg | ORAL_CAPSULE | Freq: Every day | ORAL | Status: DC

## 2013-09-08 MED ORDER — OMEPRAZOLE 40 MG PO CPDR: 40.0000 mg | DELAYED_RELEASE_CAPSULE | Freq: Every day | ORAL | Status: DC

## 2013-09-08 NOTE — Patient Instructions (Addendum)
Dear Ms. Kathy Horton, please always walk with assistance or a cane or walker  You may increase your celebrex to 100mg  twice a day and you can take your prescribed dose of tylenol  If pain gets worse or if you have trouble walking or you have loss of consciousness, headache, nausea, vomiting please go to ED right away

## 2013-09-08 NOTE — Progress Notes (Signed)
Subjective:   Patient ID: Kathy Horton female   DOB: 1922/02/23 80 y.o.   MRN: [id number]  HPI: Kathy Horton is a 80 y.o. African American female with PMH of HTN, glaucoma, hypothyroidism, osteopenia, CVA in 2002, and chronic dysphagia presenting to opc today s/p fall at home earlier today.  She reports feeling sore all over and say she fell earlier today while carrying her tray in her bedroom and losing balance.  She does not recall exactly how she fell but denies loss of conciousness.  She reports falling backwards on the side of the bed, likely hitting her back on her night-table and the sight of her right leg on the bedside commode.  She also likely hit her let hip along side the bed-rail.  Her son says he did not witness the fall but found her on the floor when she could not get up.  She says she does not know if she hit her head.  She normally has generalized body aches but feels more sore than usual today.  She is able to stand and walk with assistance.  Her son inquires if she can have something else for pain but knows that when was on anything stronger than current regimen she was very drowsy and did not like that.  Currently, she is on tylenol and celebrex.  We can likely increase the dose of either and see if that provides any relief.    She was not able to see ENT or GI yet due to problems with her medicaid card.  She apparently has the wrong pcp listed on the card and referrals cannot be done until that is corrected.  Her son did have the form filled out on our last visit but the new card they received again had the wrong information.  We hope this can be rectified soon so her left sided neck mass can be further evaluated as soon as possible.  She reports being able to eat well and has to chop her food into small pieces.     Past Medical History  Diagnosis Date  . Glaucoma     recently started on acetazolamide   . Stroke 2002    2 strokes 1 month apart, has some speech  deficit at [place] from this stroke and per the son patient has been slow  since then. Was taking aspirin before but stopped for past may years from unknown reasons.   Marland Kitchen HTN (hypertension)     not on any home medications  . Anemia    Current Outpatient Prescriptions  Medication Sig Dispense Refill  . acetaminophen (TYLENOL 8 HOUR) 650 MG CR tablet Take 1 tablet (650 mg total) by mouth every 8 (eight) hours as needed for pain.  45 tablet  3  . amLODipine (NORVASC) 10 MG tablet TAKE 1 TABLET (10 MG TOTAL) BY MOUTH DAILY.  30 tablet  6  . brimonidine (ALPHAGAN P) 0.1 % SOLN Place 1 drop into the left eye every 12 (twelve) hours.      . celecoxib (CELEBREX) 100 MG capsule Take 100 mg by mouth daily.      . diclofenac sodium (VOLTAREN) 1 % GEL Apply 2 g topically 4 (four) times daily.  1 Tube  6  . docusate sodium (COLACE) 100 MG capsule Take 1 capsule (100 mg total) by mouth 2 (two) times daily as needed for constipation.  60 capsule  3  . dorzolamide-timolol (COSOPT) 22.3-6.8 MG/ML ophthalmic solution Place 1 drop into  both eyes 2 (two) times daily.      . feeding supplement (ENSURE CLINICAL STRENGTH) LIQD Take 237 mLs by mouth 3 (three) times daily between meals.  100 Bottle  3  . hydrALAZINE (APRESOLINE) 25 MG tablet Take 1 tablet (25 mg total) by mouth 2 (two) times daily.  60 tablet  5  . levothyroxine (SYNTHROID, LEVOTHROID) 25 MCG tablet TAKE 1 AND 1/2 TABLETS (37.5 MCG TOTAL) BY MOUTH DAILY BEFORE BREAKFAST.  30 tablet  1  . omeprazole (PRILOSEC) 40 MG capsule Take 1 capsule (40 mg total) by mouth daily.  90 capsule  1  . Polyethyl Glycol-Propyl Glycol (SYSTANE ULTRA) 0.4-0.3 % SOLN Place 1 drop into both eyes 2 (two) times daily.      . polyethylene glycol (MIRALAX / GLYCOLAX) packet Take 17 g by mouth daily.  30 each  6  . Travoprost, BAK Free, (TRAVATAN) 0.004 % SOLN ophthalmic solution Place 1 drop into both eyes at bedtime.      . [DISCONTINUED] acetaZOLAMIDE (DIAMOX) 500 MG capsule  Take 500 mg by mouth daily after breakfast.       No current facility-administered medications for this visit.   Family History  Problem Relation Age of Onset  . Diabetes Mother   . Heart failure Mother   . Hypertension Mother   . Cancer Father    History   Social History  . Marital Status: Divorced    Spouse Name: N/A    Number of Children: N/A  . Years of Education: N/A   Social History Main Topics  . Smoking status: Former Smoker -- 0.00 packs/day for 50 years    Types: Cigarettes    Quit date: 04/15/1989  . Smokeless tobacco: Never Used  . Alcohol Use: No  . Drug Use: No  . Sexual Activity: No   Other Topics Concern  . Not on file   Social History Narrative   Lives alone. Has family members who check on her every day. Is pretty functional at baseline per the son. She cooks her own food and is able to perform her activities of daily living. She does not go out of the house because she could not drive and was using bus before but has stopped using it for a long time. Never smoked cigarettes, never drank alcohol or any illicit drug use. Has [employer] Medicare.   Review of Systems:  Constitutional:  Generalized weakness.  Denies fever, chills.Marland Kitchen.   HEENT:  Trouble swallowing, left neck mass.  Denies congestion.    Respiratory:  Denies SOB, DOE, cough, and wheezing.   Cardiovascular:  Denies chest pain   Gastrointestinal:  Denies nausea, vomiting, abdominal pain  Genitourinary:  Denies dysuria   Musculoskeletal:  Diffuse arthralgias and walks with walker.    Skin:  Small right leg excoriation. Denies pallor, rash.  Neurological:  Denies syncope.    Objective:  Physical Exam: Filed Vitals:   09/08/13 1603  BP: 147/71  Pulse: 73  Temp: 99 F (37.2 C)  TempSrc: Oral  Weight: 91 lb 11.2 oz (41.595 kg)  SpO2: 99%   Vitals reviewed.  General: sitting in wheelchair, complaining of feeling sore HEENT: PERRL, EOMI Cardiac: RRR  Pulm: clear to auscultation  bilaterally, no wheezing Abd: soft, BS present  Ext: warm and well perfused, no pedal edema, small abrasion on right shin s/p fall with mild erythema and tenderness to palpation, diffuse arthralgias, mild tenderness to deep palpation left hip and mid back. No visible ecchymosis.  Neuro: alert and oriented X3, cranial nerves II-XII grossly intact, strength decreased in b/l extremities but equal, sensation grossly intact, able to stand with assistance and walk with small steps and walker.   Assessment & Plan:  Discussed with Dr. Deatra Ina right leg, left hip s/p fall F/u TSH

## 2013-09-09 LAB — TSH: TSH: 6.887 u[IU]/mL — AB (ref 0.350–4.500)

## 2013-09-10 DIAGNOSIS — Z9181 History of falling: Secondary | ICD-10-CM | POA: Insufficient documentation

## 2013-09-10 MED ORDER — LEVOTHYROXINE SODIUM 50 MCG PO TABS: 50.0000 ug | ORAL_TABLET | Freq: Every day | ORAL | Status: DC

## 2013-09-10 NOTE — Assessment & Plan Note (Signed)
Stable on prilosec.  -still needs gi referral once insurance card corrected

## 2013-09-10 NOTE — Assessment & Plan Note (Signed)
Has not seen ENT yet due to problems with insurance card. Needs to be rectified as soon as possible to proceed with referral. Will ask for social work assistance again.

## 2013-09-10 NOTE — Assessment & Plan Note (Addendum)
Rechecked TSH today, down to 6.887.  Was supposed to be on 37.405mcg/day however son says recently he was only given one tablet per day by pharmacy which he thinks is 35mcg.  -will titrate up dose to 50mcg for now and will reassess in 6-12 weeks.

## 2013-09-10 NOTE — Assessment & Plan Note (Signed)
Doing well with chopped diet.  Still has issues with current medicare card that needs to be corrected prior to referrals. Will ask for social work assistance again

## 2013-09-10 NOTE — Assessment & Plan Note (Signed)
BP Readings from Last 3 Encounters:  09/08/13 147/71  06/02/13 149/70  10/10/12 121/59   Lab Results  Component Value Date   NA 139 06/02/2013   K 4.4 06/02/2013   CREATININE 0.94 06/02/2013   Assessment: Blood pressure control: controlled Progress toward BP goal:  at goal  Plan: Medications:  Continue current medications, norvasc 20mg  and hydralazine 25mg  bid

## 2013-09-10 NOTE — Assessment & Plan Note (Addendum)
S/p fall day of visit. No LOC. Does not recall hitting head, no headache. Generalized body soreness, small abrasion on RLE and left hip pain. Able to walk and stand with assistance and small steps with walker. Unable to tolerate stronger pain medication in past but son is asking if something can help.   -R leg and hip xray--no acute fracture's but notable for osteopenia and PVD.  -recommended increasing celebrex to 100mg  bid and taking tylenol prn as she has been.  -counseled if no improvement in pain or worsening in mobility or headache, or LOC, to return to clinic or ED right away

## 2013-09-11 ENCOUNTER — Telehealth: Payer: Self-pay | Admitting: *Deleted

## 2013-09-11 ENCOUNTER — Other Ambulatory Visit: Payer: Self-pay | Admitting: Otolaryngology

## 2013-09-11 ENCOUNTER — Other Ambulatory Visit (HOSPITAL_COMMUNITY)
Admission: RE | Admit: 2013-09-11 | Discharge: 2013-09-11 | Disposition: A | Discharge status: Home / Self Care | Payer: PRIVATE HEALTH INSURANCE | Source: Ambulatory Visit | Attending: Otolaryngology | Admitting: Otolaryngology

## 2013-09-11 DIAGNOSIS — R220 Localized swelling, mass and lump, head: Secondary | ICD-10-CM | POA: Insufficient documentation

## 2013-09-11 DIAGNOSIS — R221 Localized swelling, mass and lump, neck: Principal | ICD-10-CM

## 2013-09-11 NOTE — Telephone Encounter (Signed)
Message copied by Hassan BucklerPALMER, Bright Spielmann H on Fri Sep 11, 2013  1:45 PM ------      Message from: Baltazar ApoQURESHI, SAMAYA J      Created: Thu Sep 10, 2013 12:21 PM      Regarding: change in med dose       Please call ms Sergent or son and let them know their levothyroxine dose has been increased to 50mcg daily and will need to be reassessed again on next visit.             Also, we can review the xray results with them if they want, no fractures but osteopenia and pvd. Pain control as we discussed for now and fall precautions.              Thank you,            Dr q ------

## 2013-09-11 NOTE — Telephone Encounter (Signed)
Pt and her son,Bernard, inform of  Levothyroxine dose increase per Dr Virgina OrganQureshi; voiced understanding.

## 2013-09-22 NOTE — Progress Notes (Signed)
Case discussed with Dr. Qureshi soon after the resident saw the patient.  We reviewed the resident's history and exam and pertinent patient test results.  I agree with the assessment, diagnosis, and plan of care documented in the resident's note. 

## 2013-11-11 ENCOUNTER — Other Ambulatory Visit: Payer: Self-pay | Admitting: Internal Medicine

## 2013-11-11 NOTE — Telephone Encounter (Signed)
Called pharmacy the script had been blocked, pharmacist could not tell why but she unblocked it and pt will get her refill

## 2013-12-21 ENCOUNTER — Telehealth: Payer: Self-pay

## 2014-01-02 ENCOUNTER — Other Ambulatory Visit: Payer: Self-pay | Admitting: Internal Medicine

## 2014-01-10 ENCOUNTER — Other Ambulatory Visit: Payer: Self-pay | Admitting: Internal Medicine

## 2014-01-11 ENCOUNTER — Other Ambulatory Visit: Payer: Self-pay | Admitting: Internal Medicine

## 2014-01-11 DIAGNOSIS — E039 Hypothyroidism, unspecified: Secondary | ICD-10-CM

## 2014-01-11 NOTE — Telephone Encounter (Signed)
She needs a repeat TSH check. I will refill the 50 for now.  Thanks,  Dr q

## 2014-01-12 ENCOUNTER — Other Ambulatory Visit (INDEPENDENT_AMBULATORY_CARE_PROVIDER_SITE_OTHER): Payer: PRIVATE HEALTH INSURANCE

## 2014-01-12 DIAGNOSIS — E039 Hypothyroidism, unspecified: Secondary | ICD-10-CM

## 2014-01-12 NOTE — Telephone Encounter (Signed)
Pt coming this afternoon for lab appt.

## 2014-01-13 ENCOUNTER — Telehealth: Payer: Self-pay | Admitting: *Deleted

## 2014-01-13 LAB — TSH: TSH: 2.988 u[IU]/mL (ref 0.350–4.500)

## 2014-01-13 NOTE — Telephone Encounter (Signed)
Called pt - talked to pt's daughter-in-law,who brought to her lab appt yesterday. Informed her pt's TSH level is within normal range and to continued current dose of Synthroid per Dr Virgina Organ. She voiced understanding.

## 2014-01-13 NOTE — Telephone Encounter (Signed)
Message copied by Hassan Buckler on Wed Jan 13, 2014  1:28 PM ------      Message from: Baltazar Apo      Created: Wed Jan 13, 2014  8:07 AM       Please let her know to keep current dose of synthroid.            Thanks,            Dr q      ----- Message -----         From: [facility]         Sent: 01/13/2014   1:54 AM           To: Darden Palmer, MD                   ------

## 2014-04-07 ENCOUNTER — Other Ambulatory Visit: Payer: Self-pay | Admitting: Internal Medicine

## 2014-05-31 ENCOUNTER — Other Ambulatory Visit: Payer: Self-pay | Admitting: *Deleted

## 2014-05-31 MED ORDER — LEVOTHYROXINE SODIUM 50 MCG PO TABS
ORAL_TABLET | ORAL | Status: DC
Start: 2014-05-31 — End: 2014-07-15

## 2014-05-31 NOTE — Telephone Encounter (Signed)
Pt has an appt scheduled 11/3.

## 2014-06-15 ENCOUNTER — Encounter: Payer: PRIVATE HEALTH INSURANCE | Admitting: Internal Medicine

## 2014-07-13 ENCOUNTER — Encounter: Payer: PRIVATE HEALTH INSURANCE | Admitting: Internal Medicine

## 2014-07-15 ENCOUNTER — Other Ambulatory Visit: Payer: Self-pay | Admitting: Internal Medicine

## 2014-07-27 ENCOUNTER — Encounter: Payer: Self-pay | Admitting: Internal Medicine

## 2014-07-27 ENCOUNTER — Ambulatory Visit (INDEPENDENT_AMBULATORY_CARE_PROVIDER_SITE_OTHER): Payer: PRIVATE HEALTH INSURANCE | Admitting: Internal Medicine

## 2014-07-27 VITALS — BP 164/71 | HR 82 | Temp 98.6°F | Wt 74.3 lb

## 2014-07-27 DIAGNOSIS — E43 Unspecified severe protein-calorie malnutrition: Secondary | ICD-10-CM

## 2014-07-27 DIAGNOSIS — D649 Anemia, unspecified: Secondary | ICD-10-CM

## 2014-07-27 DIAGNOSIS — Z7189 Other specified counseling: Secondary | ICD-10-CM

## 2014-07-27 DIAGNOSIS — M79604 Pain in right leg: Secondary | ICD-10-CM

## 2014-07-27 DIAGNOSIS — S31000A Unspecified open wound of lower back and pelvis without penetration into retroperitoneum, initial encounter: Secondary | ICD-10-CM

## 2014-07-27 DIAGNOSIS — E039 Hypothyroidism, unspecified: Secondary | ICD-10-CM

## 2014-07-27 DIAGNOSIS — Z9181 History of falling: Secondary | ICD-10-CM

## 2014-07-27 DIAGNOSIS — I10 Essential (primary) hypertension: Secondary | ICD-10-CM

## 2014-07-27 DIAGNOSIS — X58XXXA Exposure to other specified factors, initial encounter: Secondary | ICD-10-CM

## 2014-07-27 DIAGNOSIS — H409 Unspecified glaucoma: Secondary | ICD-10-CM

## 2014-07-27 DIAGNOSIS — M79605 Pain in left leg: Secondary | ICD-10-CM

## 2014-07-27 DIAGNOSIS — S30810A Abrasion of lower back and pelvis, initial encounter: Secondary | ICD-10-CM

## 2014-07-27 DIAGNOSIS — R1310 Dysphagia, unspecified: Secondary | ICD-10-CM

## 2014-07-27 DIAGNOSIS — R221 Localized swelling, mass and lump, neck: Secondary | ICD-10-CM

## 2014-07-27 MED ORDER — DICLOFENAC SODIUM 1 % TD GEL: 2.0000 g | Freq: Four times a day (QID) | TRANSDERMAL | Status: DC | PRN

## 2014-07-27 NOTE — Assessment & Plan Note (Addendum)
Long discussion on goals of care today between myself, Kathy Horton, and her son who is primary care taker. Given her age, multiple co-morbidities, falls, and severe malnutrition family favors goals focused more on comfort than aggressive measures and investigations.  With her significant weight loss since January, malignancy cannot be ruled out, however, given her current condition benefits of aggressive investigations are likely limited.  They both seem to understand that and agree.  Less may be more in this case.  Ms. Kuss at the end of the visit was delighted when we discussed the option of sometimes  being comfortable is okay, raising her hand, saying yes, and nodding energetically.    She does apparently have fears of having some cancer expressed to her son which she did not share with me today, but we still reviewed her hx and options. She refuses colonoscopy, which I think may be reasonable but limits anemia work up in an elderly female.  She will return to have blood work done to monitor hb and iron panel and may consider iron supplementation depending on results. We discussed that even if she does have some cancer, she is likely not a good surgical candidate for extensive measures in her current condition; thus, the risks likely outweigh the benefits of some further testing and likely would not change her overall management.   She and her son would like to focus on improving her nutrition and swallowing. They are in agreement for nutrition assistance who will be consulted today and also proceeding with barium study. They would like to see GI (they have been referred in the past but unable to be seen by them due to insurance card issues) if necessary. Mechanical soft diet is to continue for now. I do not know if she would even be able to tolerate an EGD at this time but based on repeat barium study can review options with them. In the meantime, she likes chocolate ensure that her son provides for  her and we will try to increase her calorie intake with things she can easily swallow and enjoys.   She is also concerned about her falls and weakness and son is requesting assistance around the house and with her ADLs since she is fully dependent. They are not favorable to nursing home and think they can manage between her two sons and possibly an aid around the house during the day if possible. I will ask social work for their assistance in getting this set up. She may also require a wheelchair for transport out of the house and also fall protection and assessment around the house if possible. She does not want a hospital bed and likes the comfort of her home.   Finally, pain control (although does not tolerate anything very strong and on tylenol for now) is also important. They do like voltaren gel for occasional extremity pain but have been out of it for quite some time, so that will be refilled today.   They will continue this discussion over the next week at home and I will try to touch base with the son next week to see what they have ultimately decided to guide further management  -Addendum 07/28/14 Social work recommends hospice evaluation, which could provide several assistance services as well. This will need to be discussed with Kathy Horton and her son in detail, which I will try to do on our follow up discussion on goals of care.

## 2014-07-27 NOTE — Assessment & Plan Note (Signed)
Very limited vision likely also contributing to falls. Per son, she has been taken off all drops and will see opthalmologist tomorrow to find out about further management options.

## 2014-07-27 NOTE — Assessment & Plan Note (Signed)
Fall Screening 09/16/2012 06/02/2013 09/08/2013 07/27/2014  Falls in the past year? Yes No Yes Yes  Number of falls in past year 1 - - 2 or more  Was there an injury with Fall? Yes - - -  Risk Factor Category  High Fall Risk - - High Fall Risk     Assessment: Progress toward fall prevention goal:  deteriorated (no fall for past 3 months) Comments: likely due to generalized weakness and very limited vision in setting of glaucoma.  Plan: Fall prevention plans: full assist with ADLs with family members assistance. She does not want wheelchair at home, so currently using walker with assistance. I will ask for social work assistance for resources. Personal care services/aid to assist family with her care would be very beneficial. Perhaps wheelchair for transport if she is able to leave her home. Fall and home assessment for further prevention mechanisms and techniques. Currently, she is not moving very much, thus no recent falls.

## 2014-07-27 NOTE — Assessment & Plan Note (Signed)
Significant weightloss in 80 year old female. Was 91.7 pounds in January and now down to 74.3lb's. Likely due to fluctuating appetite and dysphagia that limits how much she can eat. In regards to goals of care, this is top priority and thus we will proceed with barium evaluation and/or possible further GI evaluation if appropriate and if she can tolerate. Furthermore, I will ask assistance from nutrition to see if we can increase her calorie intake. She does like chocolate ensure that the son has been providing for her.

## 2014-07-27 NOTE — Patient Instructions (Signed)
General Instructions:  Please bring your medicines with you each time you come to clinic.  Medicines may include prescription medications, over-the-counter medications, herbal remedies, eye drops, vitamins, or other pills.  Lets touch base in one week to see what you all decide.  In the meantime, we will work on swallow evaluation and please call ENT for the cyst evaluation. If you need a referral please let me know Progress Toward Treatment Goals:  Treatment Goal 07/27/2014  Blood pressure deteriorated  Prevent falls deteriorated    Self Care Goals & Plans:  No flowsheet data found.  No flowsheet data found.   Care Management & Community Referrals:  No flowsheet data found.

## 2014-07-27 NOTE — Assessment & Plan Note (Addendum)
BP Readings from Last 3 Encounters:  07/27/14 164/71  09/08/13 147/71  06/02/13 149/70   Lab Results  Component Value Date   NA 139 06/02/2013   K 4.4 06/02/2013   CREATININE 0.94 06/02/2013   Assessment: Blood pressure control: moderately elevated Progress toward BP goal:  deteriorated Comments: were not able to repeat prior to their departure, however, given goals of care, focus is more on improving nutrition and falls and less aggressive measures with medications.   Plan: Medications:  continue current medications hydralazine 25mg  bid and norvasc 10mg  Other plans: she will discuss further with son on goals of care going forward. If proceeding with more comfort measures likely that medications may be slowly decreased instead of adding any additional medications.

## 2014-07-27 NOTE — Assessment & Plan Note (Signed)
Noted to have modified barium study in 10/2011 (I do not see copy of report in epic at this time) which apparently showed structural dysphagia with differential diagnosis of esophageal phase based vs. a pharyngeal phase component (osteophytes, tight UES, etc.) per Dr. Christie NottinghamHo's documentation.  She had been doing well on a mechanical soft diet since then apparently, but lately has had significant weight loss and son endorses possibly more trouble eating. She denies any odynophagia and can swallow liquids and pills, but food sometimes feels like she may choke if not very small. She denies any regurgitation. They have not followed up with GI due to insurance card issues which appear to have resolved at this time.   Given their goals of care and wanting to improve nutritional status, I believe it is reasonable to proceed with further work up and patient and son both would like further evaluation.  Therefore, she may be better able to tolerate a repeat barium study at this point vs. EGD given her age, functional status, and co-morbidities at this time. Thus, will order barium study and depending on findings, will proceed with GI evaluation if appropriate. I agree with past pcp that she would not be a good surgical candidate given her current condition and family and patient also agree.

## 2014-07-27 NOTE — Assessment & Plan Note (Signed)
She is back to taking tylenol 325mg  by mouth twice a day. Son says it helps some but she does not tolerate stronger doses very well. She has been out of the voltaren gel but that has provided relief on her joints and extremities in the past and thus will refill today.

## 2014-07-27 NOTE — Assessment & Plan Note (Addendum)
Last TSH 2.988 01/2014. Currently on synthroid 50mcg daily. Noted to have 3mm right thyroid lobe lesion on CT neck. Very small in size, FNA usually for >1cm lesions. Family lost to follow up since January and returned today. They are in favor or just continuing to monitor but also requested me to double check with endocrinology, which I did via epic message today. Awaiting response and will update family.   Addendum 08/02/14: I spoke with Dr. Sharl MaKerr from endocrinology in regards to the case. He agrees with current management but does mention that CT does not always given an accurate size description, and thus, ultrasound is more beneficial for further evaluation.  I will review this with the patient and family and based on what they decide for goals of care going forward we will decide further plans of evaluation based on their wishes.

## 2014-07-27 NOTE — Progress Notes (Signed)
Subjective:   Patient ID: Kathy Horton female   DOB: Dec 03, 1921 80 y.o.   MRN: [id number]  HPI: Kathy Horton is a 80 y.o. female with HTN and other PMH as listed below presenting to opc today for routine visit with her son who is also primary caretaker.   Weight loss and dysphagia--significant weight loss since last visit in January. Down to 74.3lb's from 91.7lb's 08/2013. Son says she is not eating very much due to her dysphagia and she is only drinking ensure maybe once or twice a day. She does report having an appetite sometimes but then eats very slow because she is afraid of choking but does well with her food chopped up in very small pieces. No pain when swallowing. Still have not seen GI due to insurance card issues which son has now gotten fixed and they are interested in following up with them.    Falls and weakness--no falls in the past 3 months because she is very weak and has not been moving much, however, her son does she fell a few times before that mainly due to weakness and also vision loss with advancing glaucoma. She is not very keen on having a wheelchair and says no room in the house but does have a walker but needs full assist. She is basically dependent in all her ADLs. Son says they need to help her bathe as well to hold her up.  They would like assistance with her care at home if possible. Son is primary care taker but he could use assistance during the day.   Sacral wound--new area son noticed above her buttocks on the midline. Minimally tender but he has been covering it with a dressing and some antibiotic ointment.   Glaucoma--has eye appt tomorrow. Son says vision very poor. Her eye doctor has stopped all her eye drops and they plan to see her tomorrow for further evaluation and management.   Finally, I had a long discussion with both Kathy Horton and her son today in regards to goals of care.  Given her age and functional status, comfort at this point is  probably most important and quality of life. Kathy Horton agrees with that as does her son. The big goal going forward is to improve her nutrition status and dysphagia and prevent further falls. They have unfortunately been unable to see GI due to insurance card issues, however, she will likely be able to tolerate a barium swallow in her condition and then if further studies are needed can proceed with further evaluation. The son mentions that she is worried about having cancer. As such we reviewed her prior labs and hx. She has refused a colonoscopy her whole life and still does, which is likely reasonable now given her age and functional status. She is chronically anemic and last iron panel was in March 2013 that was more indicative of anemia of chronic disease. However, she understands that without colonoscopy, the possibility of colon cancer has not been excluded. She was also noted to have a 3mm right thyroid lobe lesion on CT neck that they favored observing and TSH improved with synthroid adherence and dose adjustment. Her large L sided neck mass was evaluated by ENT and found to be a cyst which was negative for malignancy on aspiration per pathology with lymphocytosis noted. He says the cyst improved after aspiration, however, has returned.   Past Medical History  Diagnosis Date  . Glaucoma     recently started on  acetazolamide   . Stroke 2002    2 strokes 1 month apart, has some speech deficit at basleine from this stroke and per the son patient has been slow  since then. Was taking aspirin before but stopped for past may years from unknown reasons.   Marland Kitchen. HTN (hypertension)     not on any home medications  . Anemia    Current Outpatient Prescriptions  Medication Sig Dispense Refill  . acetaminophen (TYLENOL 8 HOUR) 650 MG CR tablet Take 1 tablet (650 mg total) by mouth every 8 (eight) hours as needed for pain. 45 tablet 3  . amLODipine (NORVASC) 10 MG tablet TAKE 1 TABLET (10 MG TOTAL) BY MOUTH  DAILY. 30 tablet 6  . celecoxib (CELEBREX) 100 MG capsule Take 1 capsule (100 mg total) by mouth 2 (two) times daily as needed. 60 capsule 5  . docusate sodium (COLACE) 100 MG capsule Take 1 capsule (100 mg total) by mouth 2 (two) times daily as needed. 60 capsule 5  . feeding supplement (ENSURE CLINICAL STRENGTH) LIQD Take 237 mLs by mouth 3 (three) times daily between meals. 100 Bottle 3  . hydrALAZINE (APRESOLINE) 25 MG tablet TAKE 1 TABLET (25 MG TOTAL) BY MOUTH 2 (TWO) TIMES DAILY. 60 tablet 5  . levothyroxine (SYNTHROID, LEVOTHROID) 50 MCG tablet TAKE 1 TABLET (50 MCG TOTAL) BY MOUTH DAILY BEFORE BREAKFAST. 30 tablet 3  . omeprazole (PRILOSEC) 40 MG capsule Take 1 capsule (40 mg total) by mouth daily. 90 capsule 3  . polyethylene glycol (MIRALAX / GLYCOLAX) packet Take 17 g by mouth daily. 30 each 6  . brimonidine (ALPHAGAN P) 0.1 % SOLN Place 1 drop into the left eye every 12 (twelve) hours.    . diclofenac sodium (VOLTAREN) 1 % GEL Apply 2 g topically 4 (four) times daily. (Patient not taking: Reported on 07/27/2014) 1 Tube 6  . dorzolamide-timolol (COSOPT) 22.3-6.8 MG/ML ophthalmic solution Place 1 drop into both eyes 2 (two) times daily.    Bertram Gala. Polyethyl Glycol-Propyl Glycol (SYSTANE ULTRA) 0.4-0.3 % SOLN Place 1 drop into both eyes 2 (two) times daily.    . Travoprost, BAK Free, (TRAVATAN) 0.004 % SOLN ophthalmic solution Place 1 drop into both eyes at bedtime.    . [DISCONTINUED] acetaZOLAMIDE (DIAMOX) 500 MG capsule Take 500 mg by mouth daily after breakfast.     No current facility-administered medications for this visit.   Family History  Problem Relation Age of Onset  . Diabetes Mother   . Heart failure Mother   . Hypertension Mother   . Cancer Father    History   Social History  . Marital Status: Divorced    Spouse Name: N/A    Number of Children: N/A  . Years of Education: N/A   Social History Main Topics  . Smoking status: Former Smoker -- 0.00 packs/day for 50 years     Types: Cigarettes    Quit date: 04/15/1989  . Smokeless tobacco: Never Used  . Alcohol Use: No  . Drug Use: No  . Sexual Activity: No   Other Topics Concern  . None   Social History Narrative   Lives alone. Has family members who check on her every day. Is pretty functional at baseline per the son. She cooks her own food and is able to perform her activities of daily living. She does not go out of the house because she could not drive and was using bus before but has stopped using it for a long time.  Never smoked cigarettes, never drank alcohol or any illicit drug use. Has [employer] Medicare.   Review of Systems:  Constitutional:  Fatigue, fluctuating appetite, weight loss  HEENT:  Glaucoma, L neck cyst  Respiratory:  Denies SOB  Cardiovascular:  Denies chest pain  Gastrointestinal:  Dysphagia. Denies nausea, vomiting, abdominal pain.   Genitourinary:  Dependent in ADLs with using restroom, wearing diaper  Musculoskeletal:  Weakness, falls, occasional pain in extremities  Skin:  Sacral skin breakdown  Neurological:  Generalized weakness   Objective:  Physical Exam: Filed Vitals:   07/27/14 1616  BP: 164/71  Pulse: 82  Temp: 98.6 F (37 C)  TempSrc: Oral  Weight: 74 lb 4.8 oz (33.702 kg)  SpO2: 100%   Vitals reviewed. General: sitting in wheelchair, NAD, very thin HEENT: limited vision, hard of hearing Cardiac: RRR Pulm: clear to auscultation bilaterally Abd: soft, nontender, nondistended, BS present Ext: thin, moving all extremities, -tenderness to palpation, no pedal edema Neuro: alert and oriented X3, decreased strength in all extremities, requires full assist to stand, very unsteady standing Skin: Skin breakdown in sacrum, erythematous, mild tenderness to  Palpation, no drainage, dressing in place, no opening or fat visible, no inflammation  Assessment & Plan:  Discussed with Dr. Josem KaufmannKlima

## 2014-07-27 NOTE — Assessment & Plan Note (Signed)
Recurrent. Aspirated by ENT earlier this year with pathology negative for malignancy and notable for lymphocytosis. Son says improved after aspiration but has returned and they wish to return to ENT again.   -son will call for appt, may need referral again, will let us know.

## 2014-07-27 NOTE — Assessment & Plan Note (Addendum)
Hb has been around 11's. Last iron panel in 2013 more suggestive of anemia of chronic disease, will repeat cbc and iron panel on follow up visit. They are open to possible iron supplementation if needed. She denies any blood in stool but has refused and continues to refuse colonoscopy in her life.

## 2014-07-27 NOTE — Assessment & Plan Note (Signed)
Likely secondary to pressure as she has not been very mobile lately.  Slight abrasion and skin breakdown of lower midline spine above buttocks. Dry dressing in place with erythema but no drainage or visible fat. Mild tenderness to palpation. I do not think it is infected at this time.   RN provided son with protective dressing to place on wound that may help. Additionally, he is encouraged to try to have her moved frequently while in bed to avoid pressure to area and to continue to monitor.

## 2014-07-28 NOTE — Addendum Note (Signed)
Addended by: Baltazar ApoQURESHI, Dontai Pember J on: 07/28/2014 10:07 PM   Modules accepted: Orders

## 2014-07-28 NOTE — Progress Notes (Signed)
Case discussed with Dr. Qureshi at time of visit.  We reviewed the resident's history and exam and pertinent patient test results.  I agree with the assessment, diagnosis, and plan of care documented in the resident's note. 

## 2014-07-29 ENCOUNTER — Encounter: Payer: Self-pay | Admitting: Licensed Clinical Social Worker

## 2014-07-29 NOTE — Progress Notes (Signed)
Patient ID: Kathy Horton, female   DOB: [birth year], 80 y.o.   MRN: [id number] PCS request initiated and placed in PCP mailbox for review.

## 2014-07-29 NOTE — Addendum Note (Signed)
Addended by: Baltazar ApoQURESHI, Chadwin Fury J on: 07/29/2014 04:08 PM   Modules accepted: Orders

## 2014-07-30 NOTE — Addendum Note (Signed)
Addended by: Baltazar ApoQURESHI, Sarina Robleto J on: 07/30/2014 02:22 PM   Modules accepted: Orders

## 2014-08-04 ENCOUNTER — Telehealth: Payer: Self-pay | Admitting: *Deleted

## 2014-08-04 NOTE — Telephone Encounter (Signed)
Physical Therapist informed. 

## 2014-08-04 NOTE — Telephone Encounter (Signed)
[facility] -  # [phone]   Request is for a social work consult as pt is at risk for fall and is left alone during the day. Will this be ok with you?

## 2014-08-04 NOTE — Telephone Encounter (Signed)
Yes.  Thank You. 

## 2014-08-09 ENCOUNTER — Ambulatory Visit: Payer: PRIVATE HEALTH INSURANCE | Admitting: Dietician

## 2014-08-09 ENCOUNTER — Other Ambulatory Visit: Payer: PRIVATE HEALTH INSURANCE

## 2014-08-11 ENCOUNTER — Other Ambulatory Visit: Payer: Self-pay | Admitting: Internal Medicine

## 2014-08-12 ENCOUNTER — Emergency Department (HOSPITAL_COMMUNITY): Payer: PRIVATE HEALTH INSURANCE

## 2014-08-12 ENCOUNTER — Encounter (HOSPITAL_COMMUNITY): Payer: Self-pay

## 2014-08-12 ENCOUNTER — Emergency Department (HOSPITAL_COMMUNITY)
Admission: EM | Admit: 2014-08-12 | Discharge: 2014-08-12 | Disposition: A | Discharge status: Home / Self Care | Payer: PRIVATE HEALTH INSURANCE | Attending: Emergency Medicine | Admitting: Emergency Medicine

## 2014-08-12 DIAGNOSIS — H409 Unspecified glaucoma: Secondary | ICD-10-CM | POA: Insufficient documentation

## 2014-08-12 DIAGNOSIS — Z79899 Other long term (current) drug therapy: Secondary | ICD-10-CM | POA: Diagnosis not present

## 2014-08-12 DIAGNOSIS — Z87891 Personal history of nicotine dependence: Secondary | ICD-10-CM | POA: Insufficient documentation

## 2014-08-12 DIAGNOSIS — F0390 Unspecified dementia without behavioral disturbance: Secondary | ICD-10-CM | POA: Diagnosis not present

## 2014-08-12 DIAGNOSIS — Z862 Personal history of diseases of the blood and blood-forming organs and certain disorders involving the immune mechanism: Secondary | ICD-10-CM | POA: Insufficient documentation

## 2014-08-12 DIAGNOSIS — R079 Chest pain, unspecified: Secondary | ICD-10-CM | POA: Diagnosis not present

## 2014-08-12 DIAGNOSIS — E079 Disorder of thyroid, unspecified: Secondary | ICD-10-CM | POA: Diagnosis not present

## 2014-08-12 DIAGNOSIS — Z880 Allergy status to penicillin: Secondary | ICD-10-CM | POA: Diagnosis not present

## 2014-08-12 DIAGNOSIS — I10 Essential (primary) hypertension: Secondary | ICD-10-CM | POA: Diagnosis not present

## 2014-08-12 HISTORY — DX: Disorder of thyroid, unspecified: E07.9

## 2014-08-12 LAB — COMPREHENSIVE METABOLIC PANEL
ALT: 11 U/L (ref 0–35)
AST: 17 U/L (ref 0–37)
Albumin: 4.4 g/dL (ref 3.5–5.2)
Alkaline Phosphatase: 34 U/L — ABNORMAL LOW (ref 39–117)
Anion gap: 11 (ref 5–15)
BUN: 13 mg/dL (ref 6–23)
CALCIUM: 10.1 mg/dL (ref 8.4–10.5)
CO2: 28 mmol/L (ref 19–32)
Chloride: 104 mEq/L (ref 96–112)
Creatinine, Ser: 0.63 mg/dL (ref 0.50–1.10)
GFR, EST AFRICAN AMERICAN: 89 mL/min — AB (ref >90)
GFR, EST NON AFRICAN AMERICAN: 77 mL/min — AB (ref >90)
GLUCOSE: 85 mg/dL (ref 70–99)
Potassium: 4.2 mmol/L (ref 3.5–5.1)
SODIUM: 143 mmol/L (ref 135–145)
Total Bilirubin: 0.9 mg/dL (ref 0.3–1.2)
Total Protein: 8.3 g/dL (ref 6.0–8.3)

## 2014-08-12 LAB — I-STAT TROPONIN, ED
Troponin i, poc: 0.01 ng/mL (ref 0.00–0.08)
Troponin i, poc: 0.01 ng/mL (ref 0.00–0.08)

## 2014-08-12 LAB — URINALYSIS, ROUTINE W REFLEX MICROSCOPIC
BILIRUBIN URINE: NEGATIVE
Glucose, UA: NEGATIVE mg/dL
Hgb urine dipstick: NEGATIVE
Ketones, ur: NEGATIVE mg/dL
Leukocytes, UA: NEGATIVE
Nitrite: NEGATIVE
PH: 7 (ref 5.0–8.0)
Protein, ur: NEGATIVE mg/dL
Specific Gravity, Urine: 1.008 (ref 1.005–1.030)
Urobilinogen, UA: 0.2 mg/dL (ref 0.0–1.0)

## 2014-08-12 LAB — CBC WITH DIFFERENTIAL/PLATELET
Basophils Absolute: 0 10*3/uL (ref 0.0–0.1)
Basophils Relative: 1 % (ref 0–1)
EOS PCT: 1 % (ref 0–5)
Eosinophils Absolute: 0 10*3/uL (ref 0.0–0.7)
HEMATOCRIT: 38.8 % (ref 36.0–46.0)
HEMOGLOBIN: 11.8 g/dL — AB (ref 12.0–15.0)
LYMPHS ABS: 0.5 10*3/uL — AB (ref 0.7–4.0)
LYMPHS PCT: 12 % (ref 12–46)
MCH: 29.9 pg (ref 26.0–34.0)
MCHC: 30.4 g/dL (ref 30.0–36.0)
MCV: 98.2 fL (ref 78.0–100.0)
MONOS PCT: 5 % (ref 3–12)
Monocytes Absolute: 0.2 10*3/uL (ref 0.1–1.0)
Neutro Abs: 3.5 10*3/uL (ref 1.7–7.7)
Neutrophils Relative %: 81 % — ABNORMAL HIGH (ref 43–77)
Platelets: 299 10*3/uL (ref 150–400)
RBC: 3.95 MIL/uL (ref 3.87–5.11)
RDW: 13.5 % (ref 11.5–15.5)
WBC: 4.3 10*3/uL (ref 4.0–10.5)

## 2014-08-12 MED ORDER — SODIUM CHLORIDE 0.9 % IV SOLN
INTRAVENOUS | Status: DC
  Administered 2014-08-12: 13:00:00 via INTRAVENOUS

## 2014-08-12 NOTE — ED Notes (Signed)
Bed: EA54WA24 Expected date:  Expected time:  Means of arrival:  Comments: Ems- 80 yo F, UTI

## 2014-08-12 NOTE — Progress Notes (Signed)
  CARE MANAGEMENT ED NOTE 08/12/2014  Patient:  Markham JordanHOMPSON,Teshara F   Account Number:  [id number]  Date Initiated:  08/12/2014  Documentation initiated by:  Edd ArbourGIBBS,KIMBERLY  Subjective/Objective Assessment:   80 yr old [employer] independent living pt c/o neck pain     Subjective/Objective Assessment Detail:   pcp Darden Palmerqureshi, samaya  CM assisted to have diaper changed and re positioned in bed Re adjusted pt's bp cuff after complaint of being too tight Pt's son came in after diaper changed Pt talkative and pleasant     Action/Plan:   Action/Plan Detail:   Anticipated DC Date:       Status Recommendation to Physician:   Result of Recommendation:    Other ED Services  Consult Working Plan    DC Planning Services  Other  Outpatient Services - Pt will follow up    Choice offered to / List presented to:            Status of service:  Completed, signed off  ED Comments:   ED Comments Detail:

## 2014-08-12 NOTE — ED Provider Notes (Signed)
TIME SEEN: 12:10 PM  CHIEF COMPLAINT: Chest pain  HPI: Pt is a 80 y.o. F with history of hypertension not on any medications, hypothyroidism, dementia who lives at home with her sons who alternate caring for her 24/7 who presents to the emergency room with one of her sons with concerns for chest pain that occurred earlier today. Patient's son reports that one of her sons lives with her and he went to work early this morning. When he leaves for work the other son comes to stay with the patient. The other son that was there with the patient who is also in the emergency department states that he was very tired and fell sleep on the couch after helping the patient get on her bedside toilet. States that a home health physical therapist came to see the patient today. When no unanswered the door they called and the patient answered the phone reportedly telling the physical therapist that she had chest pain. The physical therapist then went to get the landlord who opened the door. Son reports that the patient did not tell him anything about the chest pain, denied chest pain to EMS and has no chest pain currently. Given her dementia she is unable to elaborate on her chest pain or any associated symptoms. Son reports to me that he feels that the patient is at her baseline. She has had a progressive slow decline over the past several months but nothing acute. He denies to me that she has been confused from her baseline over the last week, to detect rate to nursing note. States that the patient was not complaining of chest pain today he would not bring her to the emergency department.  ROS: Level V caveat for dementia  PAST MEDICAL HISTORY/PAST SURGICAL HISTORY:  Past Medical History  Diagnosis Date  . Glaucoma     recently started on acetazolamide   . HTN (hypertension)     not on any home medications  . Anemia   . Thyroid disease     MEDICATIONS:  Prior to Admission medications   Medication Sig Start Date  End Date Taking? Authorizing Provider  acetaminophen (TYLENOL 8 HOUR) 650 MG CR tablet Take 1 tablet (650 mg total) by mouth every 8 (eight) hours as needed for pain. 10/10/12   Mathis DadMichele T Ho, MD  amLODipine (NORVASC) 10 MG tablet TAKE 1 TABLET EVERY DAY 08/11/14   Baltazar ApoSamaya J Qureshi, MD  brimonidine (ALPHAGAN P) 0.1 % SOLN Place 1 drop into the left eye every 12 (twelve) hours.    Historical Provider, MD  celecoxib (CELEBREX) 100 MG capsule Take 1 capsule (100 mg total) by mouth 2 (two) times daily as needed. 04/07/14   Baltazar ApoSamaya J Qureshi, MD  diclofenac sodium (VOLTAREN) 1 % GEL Apply 2 g topically 4 (four) times daily as needed. 07/27/14   Baltazar ApoSamaya J Qureshi, MD  docusate sodium (COLACE) 100 MG capsule Take 1 capsule (100 mg total) by mouth 2 (two) times daily as needed. 09/08/13   Baltazar ApoSamaya J Qureshi, MD  dorzolamide-timolol (COSOPT) 22.3-6.8 MG/ML ophthalmic solution Place 1 drop into both eyes 2 (two) times daily.    Historical Provider, MD  feeding supplement (ENSURE CLINICAL STRENGTH) LIQD Take 237 mLs by mouth 3 (three) times daily between meals. 10/19/11   Danley DankerBradley W Wainright, MD  hydrALAZINE (APRESOLINE) 25 MG tablet TAKE 1 TABLET (25 MG TOTAL) BY MOUTH 2 (TWO) TIMES DAILY.    Baltazar ApoSamaya J Qureshi, MD  levothyroxine (SYNTHROID, LEVOTHROID) 50 MCG tablet TAKE  1 TABLET (50 MCG TOTAL) BY MOUTH DAILY BEFORE BREAKFAST. 07/16/14   Baltazar Apo, MD  omeprazole (PRILOSEC) 40 MG capsule Take 1 capsule (40 mg total) by mouth daily. 09/08/13   Baltazar Apo, MD  Polyethyl Glycol-Propyl Glycol (SYSTANE ULTRA) 0.4-0.3 % SOLN Place 1 drop into both eyes 2 (two) times daily.    Historical Provider, MD  polyethylene glycol (MIRALAX / GLYCOLAX) packet Take 17 g by mouth daily. 10/10/12   Mathis Dad, MD  Travoprost, BAK Free, (TRAVATAN) 0.004 % SOLN ophthalmic solution Place 1 drop into both eyes at bedtime.    Historical Provider, MD    ALLERGIES:  Allergies  Allergen Reactions  . Penicillins     SOCIAL HISTORY:   History  Substance Use Topics  . Smoking status: Former Smoker -- 0.00 packs/day for 50 years    Types: Cigarettes    Quit date: 04/15/1989  . Smokeless tobacco: Never Used  . Alcohol Use: No    FAMILY HISTORY: Family History  Problem Relation Age of Onset  . Diabetes Mother   . Heart failure Mother   . Hypertension Mother   . Cancer Father     EXAM: BP 189/60 mmHg  Pulse 70  Temp(Src) 97.8 F (36.6 C) (Oral)  Resp 15  SpO2 96% CONSTITUTIONAL: Alert and oriented to person and place and responds appropriately to questions intermittently. Elderly, in no distress, thin HEAD: Normocephalic EYES: Conjunctivae clear, PERRL ENT: normal nose; no rhinorrhea; slightly dry mucous membranes; pharynx without lesions noted NECK: Supple, no meningismus, no LAD; minimal amount of thyromegaly which family reports is chronic, no JVD CARD: RRR; S1 and S2 appreciated; no murmurs, no clicks, no rubs, no gallops RESP: Normal chest excursion without splinting or tachypnea; breath sounds clear and equal bilaterally; no wheezes, no rhonchi, no rales, no hypoxia or respiratory distress ABD/GI: Normal bowel sounds; non-distended; soft, non-tender, no rebound, no guarding BACK:  The back appears normal and is non-tender to palpation, there is no CVA tenderness EXT: Normal ROM in all joints; non-tender to palpation; no edema; normal capillary refill; no cyanosis    SKIN: Normal color for age and race; warm NEURO: Moves all extremities equally, reports diffuse normal sensation, cranial nerves II through XII intact PSYCH: The patient's mood and manner are appropriate. Grooming and personal hygiene are appropriate.  MEDICAL DECISION MAKING: Patient here with chest pain. Patient unable to provide any history secondary to dementia. She is at her baseline per son. Hemodynamically stable. We'll obtain labs including troponin, EKG, chest x-ray and urine. Discussed with son at length that is unclear what caused  this episode today or if she truly had pain she was unable to report pain to anyone other than this physical therapist over the phone. He agrees that if medical workup is negative that he would like to take her home.  ED PROGRESS: 2:00 PM Patient's labs are marked. First troponin negative. Chest x-ray clear. EKG is nonischemic. Will repeat second troponin. She still chest pain-free. Urine shows no sign of infection.   5:00 PM Pt's second troponin is negative. She still chest pain-free and hemodynamically stable. We'll discharge home with her son. Discussed return precautions. Advised PCP follow-up.   EKG Interpretation  Date/Time:  Thursday August 12 2014 12:44:28 EST Ventricular Rate:  61 PR Interval:  146 QRS Duration: 77 QT Interval:  444 QTC Calculation: 447 R Axis:   33 Text Interpretation:  Sinus rhythm Ventricular premature complex Anterior infarct, old Baseline wander in  lead(s) V6 Confirmed by WARD,  DO, KRISTEN [phone]([id number]) on 08/12/2014 12:47:13 PM        Layla MawKristen N Ward, DO 08/12/14 1701

## 2014-08-12 NOTE — ED Notes (Signed)
MD at bedside. EDP WARD PRESENT 

## 2014-08-12 NOTE — ED Notes (Signed)
Family at bedside. 

## 2014-08-12 NOTE — Discharge Instructions (Signed)
Chest Pain (Nonspecific) °It is often hard to give a specific diagnosis for the cause of chest pain. There is always a chance that your pain could be related to something serious, such as a heart attack or a blood clot in the lungs. You need to follow up with your health care provider for further evaluation. °CAUSES  °· Heartburn. °· Pneumonia or bronchitis. °· Anxiety or stress. °· Inflammation around your heart (pericarditis) or lung (pleuritis or pleurisy). °· A blood clot in the lung. °· A collapsed lung (pneumothorax). It can develop suddenly on its own (spontaneous pneumothorax) or from trauma to the chest. °· Shingles infection (herpes zoster virus). °The chest wall is composed of bones, muscles, and cartilage. Any of these can be the source of the pain. °· The bones can be bruised by injury. °· The muscles or cartilage can be strained by coughing or overwork. °· The cartilage can be affected by inflammation and become sore (costochondritis). °DIAGNOSIS  °Lab tests or other studies may be needed to find the cause of your pain. Your health care provider may have you take a test called an ambulatory electrocardiogram (ECG). An ECG records your heartbeat patterns over a 24-hour period. You may also have other tests, such as: °· Transthoracic echocardiogram (TTE). During echocardiography, sound waves are used to evaluate how blood flows through your heart. °· Transesophageal echocardiogram (TEE). °· Cardiac monitoring. This allows your health care provider to monitor your heart rate and rhythm in real time. °· Holter monitor. This is a portable device that records your heartbeat and can help diagnose heart arrhythmias. It allows your health care provider to track your heart activity for several days, if needed. °· Stress tests by exercise or by giving medicine that makes the heart beat faster. °TREATMENT  °· Treatment depends on what may be causing your chest pain. Treatment may include: °¨ Acid blockers for  heartburn. °¨ Anti-inflammatory medicine. °¨ Pain medicine for inflammatory conditions. °¨ Antibiotics if an infection is present. °· You may be advised to change lifestyle habits. This includes stopping smoking and avoiding alcohol, caffeine, and chocolate. °· You may be advised to keep your head raised (elevated) when sleeping. This reduces the chance of acid going backward from your stomach into your esophagus. °Most of the time, nonspecific chest pain will improve within 2-3 days with rest and mild pain medicine.  °HOME CARE INSTRUCTIONS  °· If antibiotics were prescribed, take them as directed. Finish them even if you start to feel better. °· For the next few days, avoid physical activities that bring on chest pain. Continue physical activities as directed. °· Do not use any tobacco products, including cigarettes, chewing tobacco, or electronic cigarettes. °· Avoid drinking alcohol. °· Only take medicine as directed by your health care provider. °· Follow your health care provider's suggestions for further testing if your chest pain does not go away. °· Keep any follow-up appointments you made. If you do not go to an appointment, you could develop lasting (chronic) problems with pain. If there is any problem keeping an appointment, call to reschedule. °SEEK MEDICAL CARE IF:  °· Your chest pain does not go away, even after treatment. °· You have a rash with blisters on your chest. °· You have a fever. °SEEK IMMEDIATE MEDICAL CARE IF:  °· You have increased chest pain or pain that spreads to your arm, neck, jaw, back, or abdomen. °· You have shortness of breath. °· You have an increasing cough, or you cough   up blood. °· You have severe back or abdominal pain. °· You feel nauseous or vomit. °· You have severe weakness. °· You faint. °· You have chills. °This is an emergency. Do not wait to see if the pain will go away. Get medical help at once. Call your local emergency services (911 in [place] Do not drive  yourself to the hospital. °MAKE SURE YOU:  °· Understand these instructions. °· Will watch your condition. °· Will get help right away if you are not doing well or get worse. °Document Released: 05/09/2005 Document Revised: 08/04/2013 Document Reviewed: 03/04/2008 °ExitCare® Patient Information ©2015 [employer]. This information is not intended to replace advice given to you by your health care provider. Make sure you discuss any questions you have with your health care provider. ° °Dementia °Dementia is a general term for problems with brain function. A person with dementia has memory loss and a hard time with at least one other brain function such as thinking, speaking, or problem solving. Dementia can affect social functioning, how you do your job, your mood, or your personality. The changes may be hidden for a long time. The earliest forms of this disease are usually not detected by family or friends. °Dementia can be: °· Irreversible. °· Potentially reversible. °· Partially reversible. °· Progressive. This means it can get worse over time. °CAUSES  °Irreversible dementia causes may include: °· Degeneration of brain cells (Alzheimer disease or Lewy body dementia). °· Multiple small strokes (vascular dementia). °· Infection (chronic meningitis or Creutzfeldt-Jakob disease). °· Frontotemporal dementia. This affects younger people, age 40 to 70, compared to those who have Alzheimer disease. °· Dementia associated with other disorders like Parkinson disease, Huntington disease, or HIV-associated dementia. °Potentially or partially reversible dementia causes may include: °· Medicines. °· Metabolic causes such as excessive alcohol intake, vitamin B12 deficiency, or thyroid disease. °· Masses or pressure in the brain such as a tumor, blood clot, or hydrocephalus. °SIGNS AND SYMPTOMS  °Symptoms are often hard to detect. Family members or coworkers may not notice them early in the disease process. Different people  with dementia may have different symptoms. Symptoms can include: °· A hard time with memory, especially recent memory. Long-term memory may not be impaired. °· Asking the same question multiple times or forgetting something someone just said. °· A hard time speaking your thoughts or finding certain words. °· A hard time solving problems or performing familiar tasks (such as how to use a telephone). °· Sudden changes in mood. °· Changes in personality, especially increasing moodiness or mistrust. °· Depression. °· A hard time understanding complex ideas that were never a problem in the past. °DIAGNOSIS  °There are no specific tests for dementia.  °· Your health care provider may recommend a thorough evaluation. This is because some forms of dementia can be reversible. The evaluation will likely include a physical exam and getting a detailed history from you and a family member. The history often gives the best clues and suggestions for a diagnosis. °· Memory testing may be done. A detailed brain function evaluation called neuropsychologic testing may be helpful. °· Lab tests and brain imaging (such as a CT scan or MRI scan) are sometimes important. °· Sometimes observation and re-evaluation over time is very helpful. °TREATMENT  °Treatment depends on the cause.  °· If the problem is a vitamin deficiency, it may be helped or cured with supplements. °· For dementias such as Alzheimer disease, medicines are available to stabilize or slow the   course of the disease. There are no cures for this type of dementia. °· Your health care provider can help direct you to groups, organizations, and other health care providers to help with decisions in the care of you or your loved one. °HOME CARE INSTRUCTIONS °The care of individuals with dementia is varied and dependent upon the progression of the dementia. The following suggestions are intended for the person living with, or caring for, the person with dementia. °· Create a safe  environment. °¨ Remove the locks on bathroom doors to prevent the person from accidentally locking himself or herself in. °¨ Use childproof latches on kitchen cabinets and any place where cleaning supplies, chemicals, or alcohol are kept. °¨ Use childproof covers in unused electrical outlets. °¨ Install childproof devices to keep doors and windows secured. °¨ Remove stove knobs or install safety knobs and an automatic shut-off on the stove. °¨ Lower the temperature on water heaters. °¨ Label medicines and keep them locked up. °¨ Secure knives, lighters, matches, power tools, and guns, and keep these items out of reach. °¨ Keep the house free from clutter. Remove rugs or anything that might contribute to a fall. °¨ Remove objects that might break and hurt the person. °¨ Make sure lighting is good, both inside and outside. °¨ Install grab rails as needed. °¨ Use a monitoring device to alert you to falls or other needs for help. °· Reduce confusion. °¨ Keep familiar objects and people around. °¨ Use night lights or dim lights at night. °¨ Label items or areas. °¨ Use reminders, notes, or directions for daily activities or tasks. °¨ Keep a simple, consistent routine for waking, meals, bathing, dressing, and bedtime. °¨ Create a calm, quiet environment. °¨ Place large clocks and calendars prominently. °¨ Display emergency numbers and home address near all telephones. °¨ Use cues to establish different times of the day. An example is to open curtains to let the natural light in during the day.   °· Use effective communication. °¨ Choose simple words and short sentences. °¨ Use a gentle, calm tone of voice. °¨ Be careful not to interrupt. °¨ If the person is struggling to find a word or communicate a thought, try to provide the word or thought. °¨ Ask one question at a time. Allow the person ample time to answer questions. Repeat the question again if the person does not respond. °· Reduce nighttime  restlessness. °¨ Provide a comfortable bed. °¨ Have a consistent nighttime routine. °¨ Ensure a regular walking or physical activity schedule. Involve the person in daily activities as much as possible. °¨ Limit napping during the day. °¨ Limit caffeine. °¨ Attend social events that stimulate rather than overwhelm the senses. °· Encourage good nutrition and hydration. °¨ Reduce distractions during meal times and snacks. °¨ Avoid foods that are too hot or too cold. °¨ Monitor chewing and swallowing ability. °· Continue with routine vision, hearing, dental, and medical screenings. °· Give medicines only as directed by the health care provider. °· Monitor driving abilities. Do not allow the person to drive when safe driving is no longer possible. °· Register with an identification program which could provide location assistance in the event of a missing person situation. °SEEK MEDICAL CARE IF:  °· New behavioral problems start such as moodiness, aggressiveness, or seeing things that are not there (hallucinations). °· Any new problem with brain function happens. This includes problems with balance, speech, or falling a lot. °· Problems with swallowing develop. °·   Any symptoms of other illness happen. °Small changes or worsening in any aspect of brain function can be a sign that the illness is getting worse. It can also be a sign of another medical illness such as infection. Seeing a health care provider right away is important. °SEEK IMMEDIATE MEDICAL CARE IF:  °· A fever develops. °· New or worsened confusion develops. °· New or worsened sleepiness develops. °· Staying awake becomes hard to do. °Document Released: 01/23/2001 Document Revised: 12/14/2013 Document Reviewed: 12/25/2010 °ExitCare® Patient Information ©2015 [employer]. This information is not intended to replace advice given to you by your health care provider. Make sure you discuss any questions you have with your health care provider. ° °

## 2014-08-12 NOTE — ED Notes (Signed)
Pt resides at home. Independent living. FULL CODE. Family rotates caring for her. Pt has confusion as baseline however increased confusion over the last week. Pt has strong smelling urine. Pt is ambulatory with assistance. Increased assistance needed over a period of time. Family concerned about pt's increased needs. Home PT states pt c/o of chest pain this AM. Pt denies CP on scene with EMS. EKG performed. Pt c/o of neck pain. Swollen area to left side of neck. PCP aware. PCP appt. 08/16/2014. Pt has lost weight from 130 lbs to 89 lbs in the last year. PCP aware. Pt denies any other complaints.

## 2014-08-16 ENCOUNTER — Telehealth: Payer: Self-pay | Admitting: Internal Medicine

## 2014-08-16 NOTE — Telephone Encounter (Signed)
I called Kathy Horton to speak with [name] this afternoon.  However, he was not home at this time and his other brother answered the phone. He said she is doing better since the ED visit and does not think she needs to come to the office for ED follow up visit. I did leave a message with her son to have [name] call the clinic back to touch base--follow up of goals of care discussion when possible.

## 2014-08-17 DIAGNOSIS — R269 Unspecified abnormalities of gait and mobility: Secondary | ICD-10-CM | POA: Diagnosis not present

## 2014-08-17 DIAGNOSIS — I10 Essential (primary) hypertension: Secondary | ICD-10-CM | POA: Diagnosis not present

## 2014-08-17 DIAGNOSIS — M6281 Muscle weakness (generalized): Secondary | ICD-10-CM | POA: Diagnosis not present

## 2014-08-17 DIAGNOSIS — R1310 Dysphagia, unspecified: Secondary | ICD-10-CM | POA: Diagnosis not present

## 2014-08-17 DIAGNOSIS — Z9181 History of falling: Secondary | ICD-10-CM | POA: Diagnosis not present

## 2014-08-18 ENCOUNTER — Ambulatory Visit (HOSPITAL_COMMUNITY): Admission: RE | Admit: 2014-08-18 | Payer: PRIVATE HEALTH INSURANCE | Source: Ambulatory Visit

## 2014-08-19 NOTE — Addendum Note (Signed)
Addended by: Neomia DearPOWERS, Zamier Eggebrecht E on: 08/19/2014 02:27 PM   Modules accepted: Orders

## 2014-08-20 ENCOUNTER — Telehealth: Payer: Self-pay | Admitting: *Deleted

## 2014-08-20 NOTE — Telephone Encounter (Signed)
[facility] # [phone]  PT reports that patient has cancelled her appointments this week because of Upper Respiratory  Congestion and headache. FYI They will make these visits up in the coming weeks.

## 2014-08-25 ENCOUNTER — Telehealth: Payer: Self-pay | Admitting: *Deleted

## 2014-08-25 NOTE — Telephone Encounter (Signed)
Thank you. I would really like to speak with the son Jonna CoupRufus. I have called twice including today and left messages for him to call back since we needed to complete goals of care discussions. Please can we also remind them of the barium swallow study for tomorrow.   Thank you for letting me know. If she is unwell, she can also always come to opc for an acute visit.   Thanks,  Dr q

## 2014-08-25 NOTE — Telephone Encounter (Signed)
[name] [phone] [facility] not [facility] in [facility] week. Son calls and cancel home visits due to flu, h/a, congestion and today diarrhea. Will try and see pt on 08/27/14. Pt is sch for [facility] 08/26/14. Wanted to keep you up to date. Stanton KidneyDebra Ailen Strauch RN 08/25/14 11:20AM

## 2014-08-25 NOTE — Telephone Encounter (Signed)
[facility] Fri and arrive 11:15AM.

## 2014-08-27 ENCOUNTER — Ambulatory Visit (HOSPITAL_COMMUNITY): Admission: RE | Admit: 2014-08-27 | Payer: Medicaid Other | Source: Ambulatory Visit

## 2014-09-01 ENCOUNTER — Telehealth: Payer: Self-pay | Admitting: Licensed Clinical Social Worker

## 2014-09-01 NOTE — Telephone Encounter (Signed)
CSW received voicemail that [facility] not [facility] received Kathy Horton's [facility] request.  Documentation show request was faxed on 08/11/14.  CSW re-faxed request this morning.  [facility], Den[facility].

## 2014-09-03 ENCOUNTER — Ambulatory Visit (HOSPITAL_COMMUNITY): Admission: RE | Admit: 2014-09-03 | Payer: Medicaid Other | Source: Ambulatory Visit

## 2014-09-04 ENCOUNTER — Other Ambulatory Visit: Payer: Self-pay | Admitting: Internal Medicine

## 2014-09-14 NOTE — Addendum Note (Signed)
Addended by: Bufford SpikesFULCHER, Lilianna Case N on: 09/14/2014 10:57 AM   Modules accepted: Orders

## 2014-09-20 ENCOUNTER — Encounter: Payer: Self-pay | Admitting: Adult Health

## 2014-09-22 ENCOUNTER — Encounter (HOSPITAL_COMMUNITY): Payer: Self-pay | Admitting: Emergency Medicine

## 2014-09-22 ENCOUNTER — Observation Stay (HOSPITAL_COMMUNITY)
Admission: EM | Admit: 2014-09-22 | Discharge: 2014-09-23 | Disposition: A | Discharge status: Skilled Nursing Facility | Payer: Medicare Other | Attending: Internal Medicine | Admitting: Internal Medicine

## 2014-09-22 ENCOUNTER — Emergency Department (HOSPITAL_COMMUNITY): Payer: Medicare Other

## 2014-09-22 DIAGNOSIS — R296 Repeated falls: Secondary | ICD-10-CM

## 2014-09-22 DIAGNOSIS — W19XXXA Unspecified fall, initial encounter: Secondary | ICD-10-CM

## 2014-09-22 DIAGNOSIS — J449 Chronic obstructive pulmonary disease, unspecified: Secondary | ICD-10-CM | POA: Diagnosis not present

## 2014-09-22 DIAGNOSIS — R4182 Altered mental status, unspecified: Secondary | ICD-10-CM | POA: Diagnosis not present

## 2014-09-22 DIAGNOSIS — M8580 Other specified disorders of bone density and structure, unspecified site: Secondary | ICD-10-CM | POA: Insufficient documentation

## 2014-09-22 DIAGNOSIS — D649 Anemia, unspecified: Secondary | ICD-10-CM | POA: Insufficient documentation

## 2014-09-22 DIAGNOSIS — G8929 Other chronic pain: Secondary | ICD-10-CM | POA: Diagnosis not present

## 2014-09-22 DIAGNOSIS — S79911A Unspecified injury of right hip, initial encounter: Secondary | ICD-10-CM | POA: Diagnosis not present

## 2014-09-22 DIAGNOSIS — M5032 Other cervical disc degeneration, mid-cervical region: Secondary | ICD-10-CM | POA: Diagnosis not present

## 2014-09-22 DIAGNOSIS — I10 Essential (primary) hypertension: Secondary | ICD-10-CM | POA: Diagnosis present

## 2014-09-22 DIAGNOSIS — F4489 Other dissociative and conversion disorders: Secondary | ICD-10-CM | POA: Diagnosis not present

## 2014-09-22 DIAGNOSIS — H409 Unspecified glaucoma: Secondary | ICD-10-CM | POA: Insufficient documentation

## 2014-09-22 DIAGNOSIS — Z8673 Personal history of transient ischemic attack (TIA), and cerebral infarction without residual deficits: Secondary | ICD-10-CM | POA: Insufficient documentation

## 2014-09-22 DIAGNOSIS — R778 Other specified abnormalities of plasma proteins: Secondary | ICD-10-CM

## 2014-09-22 DIAGNOSIS — E039 Hypothyroidism, unspecified: Secondary | ICD-10-CM | POA: Diagnosis not present

## 2014-09-22 DIAGNOSIS — Z79899 Other long term (current) drug therapy: Secondary | ICD-10-CM | POA: Diagnosis not present

## 2014-09-22 DIAGNOSIS — Z87891 Personal history of nicotine dependence: Secondary | ICD-10-CM | POA: Diagnosis not present

## 2014-09-22 DIAGNOSIS — M25552 Pain in left hip: Secondary | ICD-10-CM | POA: Diagnosis not present

## 2014-09-22 DIAGNOSIS — I6789 Other cerebrovascular disease: Secondary | ICD-10-CM | POA: Diagnosis not present

## 2014-09-22 DIAGNOSIS — G9340 Encephalopathy, unspecified: Secondary | ICD-10-CM | POA: Diagnosis not present

## 2014-09-22 DIAGNOSIS — R7989 Other specified abnormal findings of blood chemistry: Secondary | ICD-10-CM

## 2014-09-22 DIAGNOSIS — E43 Unspecified severe protein-calorie malnutrition: Secondary | ICD-10-CM | POA: Diagnosis present

## 2014-09-22 DIAGNOSIS — Z880 Allergy status to penicillin: Secondary | ICD-10-CM | POA: Insufficient documentation

## 2014-09-22 DIAGNOSIS — I69391 Dysphagia following cerebral infarction: Secondary | ICD-10-CM

## 2014-09-22 DIAGNOSIS — R1310 Dysphagia, unspecified: Secondary | ICD-10-CM | POA: Insufficient documentation

## 2014-09-22 DIAGNOSIS — R7301 Impaired fasting glucose: Secondary | ICD-10-CM | POA: Diagnosis present

## 2014-09-22 DIAGNOSIS — Z9181 History of falling: Secondary | ICD-10-CM | POA: Insufficient documentation

## 2014-09-22 DIAGNOSIS — Y92129 Unspecified place in nursing home as the place of occurrence of the external cause: Secondary | ICD-10-CM

## 2014-09-22 DIAGNOSIS — E46 Unspecified protein-calorie malnutrition: Secondary | ICD-10-CM | POA: Diagnosis not present

## 2014-09-22 DIAGNOSIS — R410 Disorientation, unspecified: Secondary | ICD-10-CM | POA: Diagnosis not present

## 2014-09-22 DIAGNOSIS — S79912A Unspecified injury of left hip, initial encounter: Secondary | ICD-10-CM | POA: Diagnosis not present

## 2014-09-22 HISTORY — DX: Unspecified protein-calorie malnutrition: E46

## 2014-09-22 HISTORY — DX: Dysphagia, unspecified: R13.10

## 2014-09-22 HISTORY — DX: Localized swelling, mass and lump, neck: R22.1

## 2014-09-22 LAB — MAGNESIUM: Magnesium: 2 mg/dL (ref 1.5–2.5)

## 2014-09-22 LAB — COMPREHENSIVE METABOLIC PANEL
ALK PHOS: 34 U/L — AB (ref 39–117)
ALT: 13 U/L (ref 0–35)
ANION GAP: 10 (ref 5–15)
AST: 30 U/L (ref 0–37)
Albumin: 4.4 g/dL (ref 3.5–5.2)
BUN: 11 mg/dL (ref 6–23)
CO2: 25 mmol/L (ref 19–32)
CREATININE: 0.67 mg/dL (ref 0.50–1.10)
Calcium: 10 mg/dL (ref 8.4–10.5)
Chloride: 109 mmol/L (ref 96–112)
GFR calc non Af Amer: 75 mL/min — ABNORMAL LOW (ref >90)
GFR, EST AFRICAN AMERICAN: 87 mL/min — AB (ref >90)
GLUCOSE: 101 mg/dL — AB (ref 70–99)
Potassium: 4.9 mmol/L (ref 3.5–5.1)
Sodium: 144 mmol/L (ref 135–145)
TOTAL PROTEIN: 8 g/dL (ref 6.0–8.3)
Total Bilirubin: 1.2 mg/dL (ref 0.3–1.2)

## 2014-09-22 LAB — TROPONIN I
TROPONIN I: 0.06 ng/mL — AB (ref <0.031)
TROPONIN I: 0.03 ng/mL (ref <0.031)
Troponin I: <0.03 ng/mL (ref <0.031)

## 2014-09-22 LAB — RAPID URINE DRUG SCREEN, HOSP PERFORMED
Amphetamines: NOT DETECTED
Barbiturates: NOT DETECTED
Benzodiazepines: NOT DETECTED
Cocaine: NOT DETECTED
Opiates: NOT DETECTED
Tetrahydrocannabinol: NOT DETECTED

## 2014-09-22 LAB — URINALYSIS, ROUTINE W REFLEX MICROSCOPIC
BILIRUBIN URINE: NEGATIVE
Glucose, UA: NEGATIVE mg/dL
Hgb urine dipstick: NEGATIVE
KETONES UR: NEGATIVE mg/dL
LEUKOCYTES UA: NEGATIVE
Nitrite: NEGATIVE
PROTEIN: 30 mg/dL — AB
Specific Gravity, Urine: 1.02 (ref 1.005–1.030)
Urobilinogen, UA: 1 mg/dL (ref 0.0–1.0)
pH: 7 (ref 5.0–8.0)

## 2014-09-22 LAB — LACTIC ACID, PLASMA: LACTIC ACID, VENOUS: 0.7 mmol/L (ref 0.5–2.0)

## 2014-09-22 LAB — CBC
HCT: 37.6 % (ref 36.0–46.0)
HEMOGLOBIN: 12.1 g/dL (ref 12.0–15.0)
MCH: 31.3 pg (ref 26.0–34.0)
MCHC: 32.2 g/dL (ref 30.0–36.0)
MCV: 97.2 fL (ref 78.0–100.0)
Platelets: 306 10*3/uL (ref 150–400)
RBC: 3.87 MIL/uL (ref 3.87–5.11)
RDW: 14.3 % (ref 11.5–15.5)
WBC: 8 10*3/uL (ref 4.0–10.5)

## 2014-09-22 LAB — ACETAMINOPHEN LEVEL: Acetaminophen (Tylenol), Serum: <10 ug/mL — ABNORMAL LOW (ref 10–30)

## 2014-09-22 LAB — MRSA PCR SCREENING: MRSA BY PCR: NEGATIVE

## 2014-09-22 LAB — PROTIME-INR
INR: 0.95 (ref 0.00–1.49)
Prothrombin Time: 12.7 seconds (ref 11.6–15.2)

## 2014-09-22 LAB — TSH: TSH: 7.179 u[IU]/mL — AB (ref 0.350–4.500)

## 2014-09-22 LAB — LIPASE, BLOOD: Lipase: 36 U/L (ref 11–59)

## 2014-09-22 LAB — SALICYLATE LEVEL

## 2014-09-22 LAB — URINE MICROSCOPIC-ADD ON

## 2014-09-22 LAB — ETHANOL: Alcohol, Ethyl (B): <5 mg/dL (ref 0–9)

## 2014-09-22 LAB — PHOSPHORUS: Phosphorus: 3.1 mg/dL (ref 2.3–4.6)

## 2014-09-22 MED ORDER — ENOXAPARIN SODIUM 40 MG/0.4ML ~~LOC~~ SOLN
40.0000 mg | SUBCUTANEOUS | Status: DC
  Filled 2014-09-22 (×2): qty 0.4

## 2014-09-22 MED ORDER — ACETAMINOPHEN 325 MG PO TABS
650.0000 mg | ORAL_TABLET | Freq: Four times a day (QID) | ORAL | Status: DC | PRN
  Administered 2014-09-23: 650 mg via ORAL
  Filled 2014-09-22: qty 2

## 2014-09-22 MED ORDER — LEVOTHYROXINE SODIUM 100 MCG IV SOLR: 25.0000 ug | Freq: Every day | INTRAVENOUS | Status: DC

## 2014-09-22 MED ORDER — METOPROLOL TARTRATE 1 MG/ML IV SOLN
5.0000 mg | INTRAVENOUS | Status: DC | PRN
  Filled 2014-09-22: qty 5

## 2014-09-22 MED ORDER — SODIUM CHLORIDE 0.9 % IV SOLN
INTRAVENOUS | Status: DC
  Administered 2014-09-22: 13:00:00 via INTRAVENOUS

## 2014-09-22 MED ORDER — METOPROLOL TARTRATE 1 MG/ML IV SOLN
5.0000 mg | Freq: Once | INTRAVENOUS | Status: AC
  Administered 2014-09-22: 5 mg via INTRAVENOUS
  Filled 2014-09-22: qty 5

## 2014-09-22 MED ORDER — PANTOPRAZOLE SODIUM 40 MG IV SOLR
40.0000 mg | INTRAVENOUS | Status: DC
  Administered 2014-09-22: 40 mg via INTRAVENOUS
  Filled 2014-09-22 (×2): qty 40

## 2014-09-22 MED ORDER — DICLOFENAC SODIUM 1 % TD GEL
2.0000 g | Freq: Four times a day (QID) | TRANSDERMAL | Status: DC | PRN
  Administered 2014-09-23: 2 g via TOPICAL
  Filled 2014-09-22: qty 100

## 2014-09-22 MED ORDER — SODIUM CHLORIDE 0.9 % IV SOLN
INTRAVENOUS | Status: DC
  Administered 2014-09-22: 75 mL/h via INTRAVENOUS
  Administered 2014-09-22: 18:00:00 via INTRAVENOUS

## 2014-09-22 MED ORDER — SODIUM CHLORIDE 0.9 % IV SOLN: INTRAVENOUS | Status: AC

## 2014-09-22 MED ORDER — ACETAMINOPHEN 650 MG RE SUPP: 650.0000 mg | Freq: Four times a day (QID) | RECTAL | Status: DC | PRN

## 2014-09-22 NOTE — Progress Notes (Signed)
Clinical Social Work Department CLINICAL SOCIAL WORK PLACEMENT NOTE 09/22/2014  Patient:  Kathy Horton,Kathy Horton  Account Number:  [id number] Admit date:  09/22/2014  Clinical Social Worker:  Ashley JacobsHANNAH NAIL, [place]  Date/time:  09/22/2014 11:57 AM  Clinical Social Work is seeking post-discharge placement for this patient at the following level of care:   SKILLED NURSING   (*CSW will update this form in Epic as items are completed)   09/22/2014  [facility]/family provided with [facility] of Clinical Social Work's list of facilities offering this level of care within the geographic area requested by the patient (or if unable, by the patient's family).  09/22/2014  Patient/family informed of their freedom to choose among providers that offer the needed level of care, that participate in Medicare, Medicaid or managed care program needed by the patient, have an available bed and are willing to accept the patient.  09/22/2014  Patient/family informed of [facility]' ownership interest in [facility], as well as of the fact that they are under no obligation to receive care at this facility.  PASARR submitted to EDS on 09/22/2014 PASARR number received on 09/22/2014  FL2 transmitted to all facilities in geographic area requested by pt/family on  09/22/2014 FL2 transmitted to all facilities within larger geographic area on   Patient informed that his/her managed care company has contracts with or will negotiate with  certain facilities, including the following:     Patient/family informed of bed offers received:   Patient chooses bed at  Physician recommends and patient chooses bed at    Patient to be transferred to  on   Patient to be transferred to facility by  Patient and family notified of transfer on  Name of family member notified:    The following physician request were entered in Epic:   Additional Comments:  Need PT consult for insurance purposes and function  ability.Deretha Emory.  Okie Jansson LCSW, MSW Clinical Social Work: Emergency Room [phone]

## 2014-09-22 NOTE — Progress Notes (Signed)
HO Rabbani paged to let her know I was unable to collect urine sample since order placement due to patient combativeness and non cooperation despite multiple separate attempts, will pass on to night shift to attempt to collect urine sample.

## 2014-09-22 NOTE — ED Provider Notes (Signed)
CSN: [id number]     Arrival date & time 09/22/14  0831 History   First MD Initiated Contact with Patient 09/22/14 [phone]     Chief Complaint  Patient presents with  . Altered Mental Status     HPI Pt was seen at 0925. Per pt and her son: c/o gradual onset and worsening of persistent "confusion" for the past 1 week. Pt's son states pt lives alone and he and her neighbors check on her. Pt has been found on the floor several times this past week, most recently this morning. Pt initially told EMS she had pain in her left hip, but she currently denies so now. Denies CP/SOB, no cough, no abd pain, no N/V/D, no fevers.    Past Medical History  Diagnosis Date  . Glaucoma     recently started on acetazolamide   . HTN (hypertension)     not on any home medications  . Anemia   . Thyroid disease   . Stroke   . Dysphagia   . Mass of left side of neck   . Malnutrition    Past Surgical History  Procedure Laterality Date  . Right wrist    . Eye surgery    . Appendectomy    . Tonsillectomy     Family History  Problem Relation Age of Onset  . Diabetes Mother   . Heart failure Mother   . Hypertension Mother   . Cancer Father    History  Substance Use Topics  . Smoking status: Former Smoker -- 0.00 packs/day for 50 years    Types: Cigarettes    Quit date: 04/15/1989  . Smokeless tobacco: Never Used  . Alcohol Use: No    Review of Systems ROS: Statement: All systems negative except as marked or noted in the HPI; Constitutional: Negative for fever and chills. ; ; Eyes: Negative for eye pain, redness and discharge. ; ; ENMT: Negative for ear pain, hoarseness, nasal congestion, sinus pressure and sore throat. ; ; Cardiovascular: Negative for chest pain, palpitations, diaphoresis, dyspnea and peripheral edema. ; ; Respiratory: Negative for cough, wheezing and stridor. ; ; Gastrointestinal: Negative for nausea, vomiting, diarrhea, abdominal pain, blood in stool, hematemesis, jaundice and rectal  bleeding.  ; ; Genitourinary: Negative for dysuria, flank pain and hematuria. ; ; Musculoskeletal: Negative for back pain and neck pain. Negative for swelling and trauma.; ; Skin: Negative for pruritus, rash, abrasions, blisters, bruising and skin lesion.; ; Neuro: +AMS. Negative for headache, lightheadedness and neck stiffness. Negative for weakness, altered level of consciousness, extremity weakness, paresthesias, involuntary movement, seizure and syncope.      Allergies  Penicillins  Home Medications   Prior to Admission medications   Medication Sig Start Date End Date Taking? Authorizing Provider  acetaminophen (TYLENOL) 325 MG tablet Take 325 mg by mouth every 6 (six) hours as needed for moderate pain.   Yes Historical Provider, MD  amLODipine (NORVASC) 10 MG tablet TAKE 1 TABLET EVERY DAY 08/11/14  Yes Baltazar Apo, MD  aspirin 81 MG tablet Take 81 mg by mouth 2 (two) times daily.   Yes Historical Provider, MD  celecoxib (CELEBREX) 100 MG capsule Take 1 capsule (100 mg total) by mouth 2 (two) times daily as needed. 04/07/14  Yes Baltazar Apo, MD  docusate sodium (COLACE) 100 MG capsule Take 1 capsule (100 mg total) by mouth 2 (two) times daily as needed. 09/08/13  Yes Baltazar Apo, MD  feeding supplement (ENSURE CLINICAL STRENGTH) LIQD  Take 237 mLs by mouth 3 (three) times daily between meals. Patient taking differently: Take 237 mLs by mouth 2 (two) times daily.  10/19/11  Yes Danley DankerBradley W Wainright, MD  hydrALAZINE (APRESOLINE) 25 MG tablet TAKE 1 TABLET (25 MG TOTAL) BY MOUTH 2 (TWO) TIMES DAILY.   Yes Baltazar ApoSamaya J Qureshi, MD  levothyroxine (SYNTHROID, LEVOTHROID) 50 MCG tablet TAKE 1 TABLET (50 MCG TOTAL) BY MOUTH DAILY BEFORE BREAKFAST. 07/16/14  Yes Baltazar ApoSamaya J Qureshi, MD  omeprazole (PRILOSEC) 40 MG capsule Take 1 capsule (40 mg total) by mouth daily. 09/08/13  Yes Baltazar ApoSamaya J Qureshi, MD  polyethylene glycol (MIRALAX / GLYCOLAX) packet Take 17 g by mouth daily. Patient taking  differently: Take 17 g by mouth daily as needed for moderate constipation.  10/10/12  Yes Mathis DadMichele T Ho, MD  acetaminophen (TYLENOL 8 HOUR) 650 MG CR tablet Take 1 tablet (650 mg total) by mouth every 8 (eight) hours as needed for pain. Patient not taking: Reported on 08/12/2014 10/10/12   Mathis DadMichele T Ho, MD  diclofenac sodium (VOLTAREN) 1 % GEL Apply 2 g topically 4 (four) times daily as needed. Patient not taking: Reported on 08/12/2014 07/27/14   Baltazar ApoSamaya J Qureshi, MD   BP 138/69 mmHg  Pulse 72  Temp(Src) 98.7 F (37.1 C) (Oral)  Resp 12  SpO2 99% Physical Exam  0930: Physical examination:  Nursing notes reviewed; Vital signs and O2 SAT reviewed;  Constitutional: Thin, frail. In no acute distress; Head:  Normocephalic, atraumatic; Eyes: EOMI, PERRL, No scleral icterus; ENMT: Mouth and pharynx normal, Mucous membranes dry; Neck: Supple, Full range of motion, No lymphadenopathy; Cardiovascular: Regular rate and rhythm, No gallop; Respiratory: Breath sounds clear & equal bilaterally, No wheezes.  Speaking full sentences with ease, Normal respiratory effort/excursion; Chest: Nontender, Movement normal; Abdomen: Soft, Nontender, Nondistended, Normal bowel sounds; Genitourinary: No CVA tenderness; Extremities: Pulses normal, Pelvis stable. No deformity. No tenderness, No edema, No calf edema or asymmetry.; Neuro: Awake, alert, confused re: date, events. Major CN grossly intact. No facial droop. Speech clear. Moves all extremities spontaneously and to command without apparent gross focal motor deficits.; Skin: Color normal, Warm, Dry.   ED Course  Procedures     EKG Interpretation   Date/Time:  Wednesday September 22 2014 08:39:24 EST Ventricular Rate:  76 PR Interval:  150 QRS Duration: 117 QT Interval:  411 QTC Calculation: 462 R Axis:   86 Text Interpretation:  Sinus rhythm LVH with secondary repolarization  abnormality Anterior Q waves, possibly due to LVH Artifact Poor data  quality in  current ECG precludes serial comparison Confirmed by [facility]CCMAN[facility]   MD, Nicholos JohnsKATHLEEN [phone] on 09/22/2014 10:20:47 AM      MDM  MDM Reviewed: previous chart, nursing note and vitals Reviewed previous: labs and ECG Interpretation: labs, ECG, x-ray and CT scan     Results for orders placed or performed during the hospital encounter of 09/22/14  CBC  Result Value Ref Range   WBC 8.0 4.0 - 10.5 K/uL   RBC 3.87 3.87 - 5.11 MIL/uL   Hemoglobin 12.1 12.0 - 15.0 g/dL   HCT 60.437.6 54.036.0 - 98.146.0 %   MCV 97.2 78.0 - 100.0 fL   MCH 31.3 26.0 - 34.0 pg   MCHC 32.2 30.0 - 36.0 g/dL   RDW 19.114.3 47.811.5 - 29.515.5 %   Platelets 306 150 - 400 K/uL  Comprehensive metabolic panel  Result Value Ref Range   Sodium 144 135 - 145 mmol/L   Potassium 4.9 3.5 -  5.1 mmol/L   Chloride 109 96 - 112 mmol/L   CO2 25 19 - 32 mmol/L   Glucose, Bld 101 (H) 70 - 99 mg/dL   BUN 11 6 - 23 mg/dL   Creatinine, Ser 1.61 0.50 - 1.10 mg/dL   Calcium 09.6 8.4 - 04.5 mg/dL   Total Protein 8.0 6.0 - 8.3 g/dL   Albumin 4.4 3.5 - 5.2 g/dL   AST 30 0 - 37 U/L   ALT 13 0 - 35 U/L   Alkaline Phosphatase 34 (L) 39 - 117 U/L   Total Bilirubin 1.2 0.3 - 1.2 mg/dL   GFR calc non Af Amer 75 (L) >90 mL/min   GFR calc Af Amer 87 (L) >90 mL/min   Anion gap 10 5 - 15  Urinalysis, Routine w reflex microscopic  Result Value Ref Range   Color, Urine YELLOW YELLOW   APPearance CLEAR CLEAR   Specific Gravity, Urine 1.020 1.005 - 1.030   pH 7.0 5.0 - 8.0   Glucose, UA NEGATIVE NEGATIVE mg/dL   Hgb urine dipstick NEGATIVE NEGATIVE   Bilirubin Urine NEGATIVE NEGATIVE   Ketones, ur NEGATIVE NEGATIVE mg/dL   Protein, ur 30 (A) NEGATIVE mg/dL   Urobilinogen, UA 1.0 0.0 - 1.0 mg/dL   Nitrite NEGATIVE NEGATIVE   Leukocytes, UA NEGATIVE NEGATIVE  Troponin I  Result Value Ref Range   Troponin I 0.06 (H) <0.031 ng/mL  Urine microscopic-add on  Result Value Ref Range   Squamous Epithelial / LPF RARE RARE   WBC, UA 0-2 <3 WBC/hpf   Bacteria, UA  RARE RARE   Dg Chest 1 View 09/22/2014   CLINICAL DATA:  Altered mental status  EXAM: CHEST  1 VIEW  COMPARISON:  08/12/2014  FINDINGS: Pulmonary hyperinflation and interstitial coarsening, stable and likely COPD. Heart size and aortic contours are within normal limits. There is no edema, consolidation, effusion, or pneumothorax. No acute osseous findings.  IMPRESSION: 1.  No active disease. 2. COPD.   Electronically Signed   By: Marnee Spring M.D.   On: 09/22/2014 10:30   Ct Head Wo Contrast 09/22/2014   CLINICAL DATA:  Confusion for 1 week with possible fall. Initial encounter.  EXAM: CT HEAD WITHOUT CONTRAST  CT CERVICAL SPINE WITHOUT CONTRAST  TECHNIQUE: Multidetector CT imaging of the head and cervical spine was performed following the standard protocol without intravenous contrast. Multiplanar CT image reconstructions of the cervical spine were also generated.  COMPARISON:  06/11/2013 neck CT.  10/16/2011 head CT  FINDINGS: CT HEAD FINDINGS  Skull and Sinuses:Negative for fracture or destructive process. The mastoids, middle ears, and imaged paranasal sinuses are clear.  Orbits: No acute abnormality.  Brain: No evidence of acute infarction, hemorrhage, hydrocephalus, or mass lesion/mass effect. Advanced generalized brain atrophy which has mildly progressed from 2013. There is chronic small vessel disease with ischemic gliosis confluent throughout the deep cerebral white matter.  CT CERVICAL SPINE FINDINGS  Negative for acute fracture or subluxation. Cervical spine alignment is stable from 2014 neck CT. No prevertebral edema. No gross cervical canal hematoma. Diffuse degenerative disc disease, with disc narrowing maximal at C5-6 and C6-7. No significant osseous canal or foraminal stenosis.  A cystic mass in the left neck, deep to the sternocleidomastoid is essentially stable at 5 x 2.3 cm maximal axial dimension. Given relative stability, this is presumably a benign cyst.  IMPRESSION: 1. No evidence of  intracranial or cervical spine injury. 2. Brain atrophy and chronic small vessel disease. 3. 5  cm cystic mass in the left neck, benign given relative stability since 2014.   Electronically Signed   By: Marnee Spring M.D.   On: 09/22/2014 11:22   Ct Cervical Spine Wo Contrast 09/22/2014   CLINICAL DATA:  Confusion for 1 week with possible fall. Initial encounter.  EXAM: CT HEAD WITHOUT CONTRAST  CT CERVICAL SPINE WITHOUT CONTRAST  TECHNIQUE: Multidetector CT imaging of the head and cervical spine was performed following the standard protocol without intravenous contrast. Multiplanar CT image reconstructions of the cervical spine were also generated.  COMPARISON:  06/11/2013 neck CT.  10/16/2011 head CT  FINDINGS: CT HEAD FINDINGS  Skull and Sinuses:Negative for fracture or destructive process. The mastoids, middle ears, and imaged paranasal sinuses are clear.  Orbits: No acute abnormality.  Brain: No evidence of acute infarction, hemorrhage, hydrocephalus, or mass lesion/mass effect. Advanced generalized brain atrophy which has mildly progressed from 2013. There is chronic small vessel disease with ischemic gliosis confluent throughout the deep cerebral white matter.  CT CERVICAL SPINE FINDINGS  Negative for acute fracture or subluxation. Cervical spine alignment is stable from 2014 neck CT. No prevertebral edema. No gross cervical canal hematoma. Diffuse degenerative disc disease, with disc narrowing maximal at C5-6 and C6-7. No significant osseous canal or foraminal stenosis.  A cystic mass in the left neck, deep to the sternocleidomastoid is essentially stable at 5 x 2.3 cm maximal axial dimension. Given relative stability, this is presumably a benign cyst.  IMPRESSION: 1. No evidence of intracranial or cervical spine injury. 2. Brain atrophy and chronic small vessel disease. 3. 5 cm cystic mass in the left neck, benign given relative stability since 2014.   Electronically Signed   By: Marnee Spring M.D.    On: 09/22/2014 11:22   Dg Hips Bilat With Pelvis 2v 09/22/2014   CLINICAL DATA:  81 year old female with left hip pain following a fall  EXAM: BILATERAL HIP (WITH PELVIS) 2 VIEWS  COMPARISON:  Concurrently obtained chest x-ray  FINDINGS: The bones are diffusely osteopenic. No evidence of acute fracture or malalignment. The visualized bony pelvis appears intact. Atherosclerotic vascular calcifications are noted bilaterally in the common and superficial femoral arteries. No lytic or blastic osseous lesion.  IMPRESSION: No acute fracture or malalignment identified. Of note, the bones are profoundly osteopenic which can limit evaluation for subtle, nondisplaced fracture. If there is persistent clinical concern, recommend further evaluation with MRI of the pelvis as it is significantly more sensitive than CT in setting of osteopenia.  Atherosclerotic vascular calcifications.   Electronically Signed   By: Malachy Moan M.D.   On: 09/22/2014 10:31    1155:  Pt unable to stand for orthostatic VS. Troponin elevated; pt denies CP. Will repeat. T/C to [facility] Resident, case discussed, including:  HPI, pertinent PM/SHx, VS/PE, dx testing, ED course and treatment:  Agreeable to admit, requests to write temporary orders, obtain tele bed to Dr. Donnelly Stager service.     Samuel Jester, DO 09/25/14 [phone]

## 2014-09-22 NOTE — ED Notes (Signed)
Pt brought in via EMS due to son stating pt is more confused for the past week. Pt apparently fell last week, no tenderness noted in neck but is noted in back per EMS. Pt is alert to who she is and where she is at, unaware of current year. Pt reports tenderness on left hip. GCS reported by EMS 14. Pt is blind in right eye.

## 2014-09-22 NOTE — ED Notes (Signed)
Social work and case management informed patients family would like to get information on full time home health of placement in nursing home. States will speak to patient.

## 2014-09-22 NOTE — H&P (Signed)
Date: 09/22/2014               Patient Name:  Kathy Horton MRN: [id number]  DOB: [birth year] Age / Sex: 81 y.o., female   PCP: Baltazar Apo, MD         Medical Service: Internal Medicine Teaching Service         Attending Physician: Dr. Burns Spain, MD    First Contact: Dr. Mitzie Na Moding  Pager: [phone]  Second Contact: Dr. Otis Brace Pager: [phone]       After Hours (After 5p/  First Contact Pager: [phone]  weekends / holidays): Second Contact Pager: [phone]   Chief Complaint: Altered mental status   History of Present Illness: Kathy Horton is a 81yo woman with PMHx of HTN, CVA, anemia, malnutrition, and hypothyroidism who presented to the ED with altered mental status. Per ED note, patient reportedly had gradual onset and worsening of persistent "confusion" for the past week. However, patient is alert and oriented to person and place on exam, though she is very agitated. She states she has fallen several times in the last few weeks at home. She lives at home by herself. She reports yesterday she fell backwards and hit her head. She denies losing consciousness. She denies any pain at this time. She reports poor appetite. She denies any fever, chills, urinary symptoms, cough, congestion, or abdominal pain.   Meds: Current Facility-Administered Medications  Medication Dose Route Frequency Provider Last Rate Last Dose  . 0.9 %  sodium chloride infusion   Intravenous STAT Samuel Jester, DO      . 0.9 %  sodium chloride infusion   Intravenous Continuous Otis Brace, MD 75 mL/hr at 09/22/14 1818    . acetaminophen (TYLENOL) tablet 650 mg  650 mg Oral Q6H PRN Otis Brace, MD       Or  . acetaminophen (TYLENOL) suppository 650 mg  650 mg Rectal Q6H PRN Marjan Rabbani, MD      . diclofenac sodium (VOLTAREN) 1 % transdermal gel 2 g  2 g Topical QID PRN Marjan Rabbani, MD      . enoxaparin (LOVENOX) injection 40 mg  40 mg Subcutaneous Q24H Marjan Rabbani, MD    40 mg at 09/22/14 1808  . [START ON 09/23/2014] levothyroxine (SYNTHROID, LEVOTHROID) injection 25 mcg  25 mcg Intravenous Daily Marjan Rabbani, MD      . metoprolol (LOPRESSOR) injection 5 mg  5 mg Intravenous PRN Marjan Rabbani, MD      . pantoprazole (PROTONIX) injection 40 mg  40 mg Intravenous Q24H Marjan Rabbani, MD   40 mg at 09/22/14 1808    Allergies: Allergies as of 09/22/2014 - Review Complete 09/22/2014  Allergen Reaction Noted  . Penicillins  10/16/2011   Past Medical History  Diagnosis Date  . Glaucoma     recently started on acetazolamide   . HTN (hypertension)     not on any home medications  . Anemia   . Thyroid disease   . Stroke   . Dysphagia   . Mass of left side of neck   . Malnutrition    Past Surgical History  Procedure Laterality Date  . Right wrist    . Eye surgery    . Appendectomy    . Tonsillectomy     Family History  Problem Relation Age of Onset  . Diabetes Mother   . Heart failure Mother   . Hypertension Mother   . Cancer Father    History  Social History  . Marital Status: Divorced    Spouse Name: N/A  . Number of Children: N/A  . Years of Education: N/A   Occupational History  . Not on file.   Social History Main Topics  . Smoking status: Former Smoker -- 0.00 packs/day for 50 years    Types: Cigarettes    Quit date: 04/15/1989  . Smokeless tobacco: Never Used  . Alcohol Use: No  . Drug Use: No  . Sexual Activity: No   Other Topics Concern  . Not on file   Social History Narrative   Lives alone. Has family members who check on her every day. Is pretty functional at baseline per the son. She cooks her own food and is able to perform her activities of daily living. She does not go out of the house because she could not drive and was using bus before but has stopped using it for a long time. Never smoked cigarettes, never drank alcohol or any illicit drug use. Has [employer] Medicare.    Review of  Systems: General: Denies night sweats HEENT: Denies headaches, ear pain, changes in vision, rhinorrhea, sore throat CV: Denies CP, palpitations, SOB, orthopnea Pulm: Denies SOB, cough, wheezing GI: Denies nausea, vomiting, diarrhea, constipation, melena, hematochezia GU: See HPI Msk: Denies muscle cramps, joint pains Neuro: Denies weakness, numbness, tingling Skin: Denies rashes, bruising  Physical Exam: Blood pressure 183/90, pulse 91, temperature 97.7 F (36.5 C), temperature source Oral, resp. rate 20, height 5\' 2"  (1.575 m), weight 70 lb (31.752 kg), SpO2 100 %. General: cachectic appearing, agitated, yelling out HEENT: [place]/AT, EOMI, blind in R eye, mucus membranes dry CV: RRR, no m/g/r Pulm: CTA bilaterally, breaths non-labored Abd: BS+, soft, non-tender Ext: warm, no edema, moves all Neuro: alert and oriented to person and place. Able to state birthdate correctly. Strength intact. No focal deficits.   Lab results: Basic Metabolic Panel:  Recent Labs  16/05/9601/10/16 0845  NA 144  K 4.9  CL 109  CO2 25  GLUCOSE 101*  BUN 11  CREATININE 0.67  CALCIUM 10.0   Liver Function Tests:  Recent Labs  09/22/14 0845  AST 30  ALT 13  ALKPHOS 34*  BILITOT 1.2  PROT 8.0  ALBUMIN 4.4   CBC:  Recent Labs  09/22/14 0845  WBC 8.0  HGB 12.1  HCT 37.6  MCV 97.2  PLT 306   Cardiac Enzymes:  Recent Labs  09/22/14 0845 09/22/14 1150  TROPONINI 0.06* <0.03   Urinalysis:  Recent Labs  09/22/14 0933  COLORURINE YELLOW  LABSPEC 1.020  PHURINE 7.0  GLUCOSEU NEGATIVE  HGBUR NEGATIVE  BILIRUBINUR NEGATIVE  KETONESUR NEGATIVE  PROTEINUR 30*  UROBILINOGEN 1.0  NITRITE NEGATIVE  LEUKOCYTESUR NEGATIVE   Imaging results:  Dg Chest 1 View  09/22/2014   CLINICAL DATA:  Altered mental status  EXAM: CHEST  1 VIEW  COMPARISON:  08/12/2014  FINDINGS: Pulmonary hyperinflation and interstitial coarsening, stable and likely COPD. Heart size and aortic contours are within  normal limits. There is no edema, consolidation, effusion, or pneumothorax. No acute osseous findings.  IMPRESSION: 1.  No active disease. 2. COPD.   Electronically Signed   By: Marnee SpringJonathon  Watts M.D.   On: 09/22/2014 10:30   Ct Head Wo Contrast  09/22/2014   CLINICAL DATA:  Confusion for 1 week with possible fall. Initial encounter.  EXAM: CT HEAD WITHOUT CONTRAST  CT CERVICAL SPINE WITHOUT CONTRAST  TECHNIQUE: Multidetector CT imaging of the head and  cervical spine was performed following the standard protocol without intravenous contrast. Multiplanar CT image reconstructions of the cervical spine were also generated.  COMPARISON:  06/11/2013 neck CT.  10/16/2011 head CT  FINDINGS: CT HEAD FINDINGS  Skull and Sinuses:Negative for fracture or destructive process. The mastoids, middle ears, and imaged paranasal sinuses are clear.  Orbits: No acute abnormality.  Brain: No evidence of acute infarction, hemorrhage, hydrocephalus, or mass lesion/mass effect. Advanced generalized brain atrophy which has mildly progressed from 2013. There is chronic small vessel disease with ischemic gliosis confluent throughout the deep cerebral white matter.  CT CERVICAL SPINE FINDINGS  Negative for acute fracture or subluxation. Cervical spine alignment is stable from 2014 neck CT. No prevertebral edema. No gross cervical canal hematoma. Diffuse degenerative disc disease, with disc narrowing maximal at C5-6 and C6-7. No significant osseous canal or foraminal stenosis.  A cystic mass in the left neck, deep to the sternocleidomastoid is essentially stable at 5 x 2.3 cm maximal axial dimension. Given relative stability, this is presumably a benign cyst.  IMPRESSION: 1. No evidence of intracranial or cervical spine injury. 2. Brain atrophy and chronic small vessel disease. 3. 5 cm cystic mass in the left neck, benign given relative stability since 2014.   Electronically Signed   By: Marnee Spring M.D.   On: 09/22/2014 11:22   Ct  Cervical Spine Wo Contrast  09/22/2014   CLINICAL DATA:  Confusion for 1 week with possible fall. Initial encounter.  EXAM: CT HEAD WITHOUT CONTRAST  CT CERVICAL SPINE WITHOUT CONTRAST  TECHNIQUE: Multidetector CT imaging of the head and cervical spine was performed following the standard protocol without intravenous contrast. Multiplanar CT image reconstructions of the cervical spine were also generated.  COMPARISON:  06/11/2013 neck CT.  10/16/2011 head CT  FINDINGS: CT HEAD FINDINGS  Skull and Sinuses:Negative for fracture or destructive process. The mastoids, middle ears, and imaged paranasal sinuses are clear.  Orbits: No acute abnormality.  Brain: No evidence of acute infarction, hemorrhage, hydrocephalus, or mass lesion/mass effect. Advanced generalized brain atrophy which has mildly progressed from 2013. There is chronic small vessel disease with ischemic gliosis confluent throughout the deep cerebral white matter.  CT CERVICAL SPINE FINDINGS  Negative for acute fracture or subluxation. Cervical spine alignment is stable from 2014 neck CT. No prevertebral edema. No gross cervical canal hematoma. Diffuse degenerative disc disease, with disc narrowing maximal at C5-6 and C6-7. No significant osseous canal or foraminal stenosis.  A cystic mass in the left neck, deep to the sternocleidomastoid is essentially stable at 5 x 2.3 cm maximal axial dimension. Given relative stability, this is presumably a benign cyst.  IMPRESSION: 1. No evidence of intracranial or cervical spine injury. 2. Brain atrophy and chronic small vessel disease. 3. 5 cm cystic mass in the left neck, benign given relative stability since 2014.   Electronically Signed   By: Marnee Spring M.D.   On: 09/22/2014 11:22   Dg Hips Bilat With Pelvis 2v  09/22/2014   CLINICAL DATA:  81 year old female with left hip pain following a fall  EXAM: BILATERAL HIP (WITH PELVIS) 2 VIEWS  COMPARISON:  Concurrently obtained chest x-ray  FINDINGS: The bones  are diffusely osteopenic. No evidence of acute fracture or malalignment. The visualized bony pelvis appears intact. Atherosclerotic vascular calcifications are noted bilaterally in the common and superficial femoral arteries. No lytic or blastic osseous lesion.  IMPRESSION: No acute fracture or malalignment identified. Of note, the bones are profoundly osteopenic which  can limit evaluation for subtle, nondisplaced fracture. If there is persistent clinical concern, recommend further evaluation with MRI of the pelvis as it is significantly more sensitive than CT in setting of osteopenia.  Atherosclerotic vascular calcifications.   Electronically Signed   By: Malachy Moan M.D.   On: 09/22/2014 10:31    Other results: EKG: Sinus rhythm, LVH  Assessment & Plan by Problem:  Acute Encephalopathy: Patient alert and oriented on exam, but very agitated. Will have to discuss baseline mental status with family. No infection apparent as no fever, WBC count normal, UA negative, CXR negative. CT Head was negative. No metabolic derangements present. She is not on any psychiatric or pain meds to cause AMS. Question compliance with medications, possible that not taking Synthroid and thyroid function decreased. Will rule out drugs/alcohol. - Check UDS, EtOH - Check TSH - Check Salicylate, acetaminophen levels  - Sitter at bedside to help redirect  HTN: BP in 140-160s systolic. When evaluating patient her BP spiked to 214/97, but very agitated. She was given Lopressor 5 mg IV x 1. Improved to 183/90. She takes Amlodipine 10 mg daily and Hydralazine 25 mg BID at home.  - Hold PO BP meds - Start Lopressor 5 mg IV PRN SBP >170  Normocytic Anemia: Iron 10, TIBC 233 in 2013. MCV currently 98. Likely anemia of chronic disease. - CBC intermittently   Malnutrition: Patient cachectic appearing on exam. She reports poor PO intake. Concern that she may not be able to care for herself at home as she is living by herself.   - Start Ensure once confirmed to safely tolerate PO intake - Consult to SW for possible SNF placement - Consult PT, OT  Hypothyroidism: Last TSH 2.988 in June 2015. Patient is on Synthroid 50 mg daily at home.  - Continue home Synthroid  Diet: NPO. Will need beside swallow with hx of dysphagia  VTE PPx: Lovenox SQ Dispo: Disposition is deferred at this time, awaiting improvement of current medical problems. Anticipated discharge in approximately 1-2 day(s).   The patient does have a current PCP Baltazar Apo, MD) and does need an [facility] hospital follow-up appointment after discharge.  The patient does not have transportation limitations that hinder transportation to clinic appointments.  Signed: Rich Number, MD 09/22/2014, 6:58 PM

## 2014-09-22 NOTE — Progress Notes (Signed)
Clinical Social Work Department BRIEF PSYCHOSOCIAL ASSESSMENT 09/22/2014  Patient:  Kathy Horton, Kathy Horton     Account Number:  [id number]     Admit date:  09/22/2014  Clinical Social Worker:  Forest Gleason  Date/Time:  09/22/2014 11:25 AM  Referred by:  Physician  Date Referred:  09/22/2014 Referred for  SNF Placement  Psychosocial assessment   Other Referral:   Interview type:  Family Other interview type:   Patient in room, however very confused and not able to participate in assessment    PSYCHOSOCIAL DATA Living Status:  FAMILY Admitted from facility:   Level of care:  Independent Living Primary support name:  Son: Kathy Horton Primary support relationship to patient:  FAMILY Degree of support available:   Patient lives in a Senior Apartment with her son.  Son works night shift and unable to provide 24 hour care at this time.  Daily care is provided, but it has become too much for son to handle.    CURRENT CONCERNS Current Concerns  Post-Acute Placement   Other Concerns:   Not a safe DC plan if patient returns home due to patient being alone.    SOCIAL WORK ASSESSMENT / PLAN LCSW recieved consult from MD and RN regarding family requesting placement.  patient is currently living in [employer] with her son who does take care of her during the day. Son works full time during the night and unable to provide that care. Reports he has 2 brothers, but they are unreliable at this time, thus he is the sole caregiver. patient needs constant care and when son not home, patient is bed bound. patient has become increasingly confused and unable to do things for self.  Son spoke with representative from apartment complex and he was put in touch with SNF liasion Tammy from [facility]. He is requesting [facility] for placement.  Tammy called, made aware of referral and she met family on Monday 2/8.  GL aware of referral and agreeable at this time to accept.  Plan:  Complete  placement process for SNF:  [facility], [facility], bed offer.  Patient remains in ED and currently being observed with unknown disposition admitted vs DC to SNF.  LCSW to follow in ED and if admitted, LCSW to transfer case to unit CSW.   Assessment/plan status:  Psychosocial Support/Ongoing Assessment of Needs Other assessment/ plan:   none currently   Information/referral to community resources:   Bed referral,  FL2 completed    PATIENT'S/FAMILY'S RESPONSE TO PLAN OF CARE: Son reports being overlly tired and stressed. Reports he has become angry at times because he works full time and tries his best to care for his mom, but does not feel like he can do it. He reports she needs continued care and as he does not want to put her in the nursing home, he feels at this time this is his only option.  LCSW was able to provide support and encouragement, reviewed process and validated patient's stressors and feelings.  Son reports relief as plans are being made to give his mom the best care possible.    Lane Hacker, MSW Clinical Social Work: Emergency Room [phone]

## 2014-09-22 NOTE — ED Notes (Signed)
Pt returned from xray, placed back into room and on monitor.

## 2014-09-22 NOTE — Progress Notes (Addendum)
NURSING PROGRESS NOTE  Kathy Horton [id number] Admission Data: 09/22/2014 3:44 PM Attending Provider: Burns SpainElizabeth A Butcher, MD ZHY:QMVHQIOPCP:Qureshi, Lindalou HoseSamaya, MD Code Status: Full Code Allergies:  Penicillins Past Medical History:   has a past medical history of Glaucoma; HTN (hypertension); Anemia; Thyroid disease; Stroke; Dysphagia; Mass of left side of neck; and Malnutrition. Past Surgical History:   has past surgical history that includes right wrist; Eye surgery; Appendectomy; and Tonsillectomy. Social History:   reports that she quit smoking about 25 years ago. Her smoking use included Cigarettes. She smoked 0.00 packs per day for 50 years. She has never used smokeless tobacco. She reports that she does not drink alcohol or use illicit drugs.  Kathy Horton is a 81 y.o. female patient admitted from ED:   Last Documented Vital Signs: BP 214/97, HR 128, Temp 97.7, SPO2 100% on RA, Resp 20. MD Rabbani at bedside currently assessing the patient.  Cardiac Monitoring: Box # 14 in place. Cardiac monitor yields:sinus tachycardia.  IV Fluids:  IV in place, occlusive dsg intact without redness, IV cath forearm right, condition patent and no redness normal saline.   Skin: Intact   Patient/Family orientated to room. Information packet attempted to be given to patient, patient is extremely confused and agitated. Admission inpatient armband information verified with patient to include name and date of birth and placed on patient arm. Side rails up x 2, fall assessment and education completed with patient/family. Patient/family unable to verbalize understanding of risk associated with falls or verbalize understanding to call for assistance before getting out of bed due to confusion and disorientation. Call light within reach. Family unable to voice and demonstrate understanding of unit orientation instructions. Unable to complete admission at this time due to patient mental status.  Will continue to  evaluate and treat per MD orders.   Leane PlattSpencer Torrion Witter RN, BS, BSN

## 2014-09-23 DIAGNOSIS — I10 Essential (primary) hypertension: Secondary | ICD-10-CM | POA: Diagnosis not present

## 2014-09-23 DIAGNOSIS — R419 Unspecified symptoms and signs involving cognitive functions and awareness: Secondary | ICD-10-CM | POA: Diagnosis not present

## 2014-09-23 DIAGNOSIS — I6930 Unspecified sequelae of cerebral infarction: Secondary | ICD-10-CM | POA: Diagnosis not present

## 2014-09-23 DIAGNOSIS — R488 Other symbolic dysfunctions: Secondary | ICD-10-CM | POA: Diagnosis not present

## 2014-09-23 DIAGNOSIS — M8580 Other specified disorders of bone density and structure, unspecified site: Secondary | ICD-10-CM | POA: Diagnosis not present

## 2014-09-23 DIAGNOSIS — E46 Unspecified protein-calorie malnutrition: Secondary | ICD-10-CM | POA: Diagnosis not present

## 2014-09-23 DIAGNOSIS — R221 Localized swelling, mass and lump, neck: Secondary | ICD-10-CM | POA: Diagnosis not present

## 2014-09-23 DIAGNOSIS — E039 Hypothyroidism, unspecified: Secondary | ICD-10-CM

## 2014-09-23 DIAGNOSIS — H409 Unspecified glaucoma: Secondary | ICD-10-CM | POA: Diagnosis not present

## 2014-09-23 DIAGNOSIS — Z79899 Other long term (current) drug therapy: Secondary | ICD-10-CM | POA: Diagnosis not present

## 2014-09-23 DIAGNOSIS — R1312 Dysphagia, oropharyngeal phase: Secondary | ICD-10-CM | POA: Diagnosis not present

## 2014-09-23 DIAGNOSIS — E43 Unspecified severe protein-calorie malnutrition: Secondary | ICD-10-CM | POA: Diagnosis not present

## 2014-09-23 DIAGNOSIS — K5900 Constipation, unspecified: Secondary | ICD-10-CM | POA: Diagnosis not present

## 2014-09-23 DIAGNOSIS — Z87891 Personal history of nicotine dependence: Secondary | ICD-10-CM | POA: Diagnosis not present

## 2014-09-23 DIAGNOSIS — I6789 Other cerebrovascular disease: Secondary | ICD-10-CM | POA: Diagnosis not present

## 2014-09-23 DIAGNOSIS — N390 Urinary tract infection, site not specified: Secondary | ICD-10-CM | POA: Diagnosis not present

## 2014-09-23 DIAGNOSIS — M810 Age-related osteoporosis without current pathological fracture: Secondary | ICD-10-CM | POA: Diagnosis not present

## 2014-09-23 DIAGNOSIS — R296 Repeated falls: Secondary | ICD-10-CM | POA: Diagnosis not present

## 2014-09-23 DIAGNOSIS — G9340 Encephalopathy, unspecified: Secondary | ICD-10-CM

## 2014-09-23 DIAGNOSIS — E038 Other specified hypothyroidism: Secondary | ICD-10-CM | POA: Diagnosis not present

## 2014-09-23 DIAGNOSIS — Z9181 History of falling: Secondary | ICD-10-CM | POA: Diagnosis not present

## 2014-09-23 DIAGNOSIS — F99 Mental disorder, not otherwise specified: Secondary | ICD-10-CM | POA: Diagnosis not present

## 2014-09-23 DIAGNOSIS — K210 Gastro-esophageal reflux disease with esophagitis: Secondary | ICD-10-CM | POA: Diagnosis not present

## 2014-09-23 DIAGNOSIS — M6281 Muscle weakness (generalized): Secondary | ICD-10-CM | POA: Diagnosis not present

## 2014-09-23 DIAGNOSIS — M545 Low back pain: Secondary | ICD-10-CM

## 2014-09-23 DIAGNOSIS — Z8673 Personal history of transient ischemic attack (TIA), and cerebral infarction without residual deficits: Secondary | ICD-10-CM | POA: Diagnosis not present

## 2014-09-23 DIAGNOSIS — N3946 Mixed incontinence: Secondary | ICD-10-CM | POA: Diagnosis not present

## 2014-09-23 DIAGNOSIS — E034 Atrophy of thyroid (acquired): Secondary | ICD-10-CM | POA: Diagnosis not present

## 2014-09-23 DIAGNOSIS — I69991 Dysphagia following unspecified cerebrovascular disease: Secondary | ICD-10-CM | POA: Diagnosis not present

## 2014-09-23 DIAGNOSIS — R7301 Impaired fasting glucose: Secondary | ICD-10-CM | POA: Diagnosis not present

## 2014-09-23 DIAGNOSIS — K219 Gastro-esophageal reflux disease without esophagitis: Secondary | ICD-10-CM | POA: Diagnosis not present

## 2014-09-23 DIAGNOSIS — Z880 Allergy status to penicillin: Secondary | ICD-10-CM | POA: Diagnosis not present

## 2014-09-23 DIAGNOSIS — R269 Unspecified abnormalities of gait and mobility: Secondary | ICD-10-CM | POA: Diagnosis not present

## 2014-09-23 DIAGNOSIS — B9620 Unspecified Escherichia coli [E. coli] as the cause of diseases classified elsewhere: Secondary | ICD-10-CM | POA: Diagnosis not present

## 2014-09-23 DIAGNOSIS — D649 Anemia, unspecified: Secondary | ICD-10-CM | POA: Diagnosis not present

## 2014-09-23 DIAGNOSIS — B029 Zoster without complications: Secondary | ICD-10-CM | POA: Diagnosis not present

## 2014-09-23 DIAGNOSIS — G8929 Other chronic pain: Secondary | ICD-10-CM | POA: Diagnosis not present

## 2014-09-23 DIAGNOSIS — R1310 Dysphagia, unspecified: Secondary | ICD-10-CM | POA: Diagnosis not present

## 2014-09-23 LAB — BASIC METABOLIC PANEL
Anion gap: 10 (ref 5–15)
BUN: 10 mg/dL (ref 6–23)
CALCIUM: 9.3 mg/dL (ref 8.4–10.5)
CO2: 23 mmol/L (ref 19–32)
Chloride: 110 mmol/L (ref 96–112)
Creatinine, Ser: 0.66 mg/dL (ref 0.50–1.10)
GFR calc Af Amer: 87 mL/min — ABNORMAL LOW (ref >90)
GFR, EST NON AFRICAN AMERICAN: 75 mL/min — AB (ref >90)
GLUCOSE: 62 mg/dL — AB (ref 70–99)
Potassium: 3.8 mmol/L (ref 3.5–5.1)
SODIUM: 143 mmol/L (ref 135–145)

## 2014-09-23 LAB — T4, FREE: FREE T4: 0.88 ng/dL (ref 0.80–1.80)

## 2014-09-23 LAB — FOLATE: Folate: 17.2 ng/mL

## 2014-09-23 LAB — VITAMIN B12: Vitamin B-12: 406 pg/mL (ref 211–911)

## 2014-09-23 MED ORDER — LEVOTHYROXINE SODIUM 100 MCG IV SOLR
50.0000 ug | Freq: Every day | INTRAVENOUS | Status: DC
  Administered 2014-09-23: 50 ug via INTRAVENOUS
  Filled 2014-09-23: qty 5

## 2014-09-23 MED ORDER — LEVOTHYROXINE SODIUM 50 MCG PO TABS: 75.0000 ug | ORAL_TABLET | Freq: Every day | ORAL | Status: DC

## 2014-09-23 MED ORDER — PANTOPRAZOLE SODIUM 40 MG PO TBEC
40.0000 mg | DELAYED_RELEASE_TABLET | Freq: Every day | ORAL | Status: DC
  Administered 2014-09-23: 40 mg via ORAL
  Filled 2014-09-23: qty 1

## 2014-09-23 MED ORDER — DOCUSATE SODIUM 100 MG PO CAPS: 100.0000 mg | ORAL_CAPSULE | Freq: Two times a day (BID) | ORAL | Status: DC | PRN

## 2014-09-23 MED ORDER — ENOXAPARIN SODIUM 30 MG/0.3ML ~~LOC~~ SOLN
15.0000 mg | SUBCUTANEOUS | Status: DC
  Filled 2014-09-23: qty 0.15

## 2014-09-23 MED ORDER — SULFAMETHOXAZOLE-TRIMETHOPRIM 800-160 MG PO TABS: 1.0000 | ORAL_TABLET | Freq: Two times a day (BID) | ORAL | Status: AC

## 2014-09-23 MED ORDER — HYDRALAZINE HCL 25 MG PO TABS
25.0000 mg | ORAL_TABLET | Freq: Two times a day (BID) | ORAL | Status: DC
  Administered 2014-09-23: 25 mg via ORAL
  Filled 2014-09-23 (×2): qty 1

## 2014-09-23 MED ORDER — ENSURE COMPLETE PO LIQD
237.0000 mL | Freq: Three times a day (TID) | ORAL | Status: DC
  Administered 2014-09-23: 237 mL via ORAL

## 2014-09-23 MED ORDER — ACETAMINOPHEN 325 MG PO TABS: 325.0000 mg | ORAL_TABLET | Freq: Four times a day (QID) | ORAL | Status: DC | PRN

## 2014-09-23 MED ORDER — ASPIRIN 81 MG PO CHEW
81.0000 mg | CHEWABLE_TABLET | Freq: Two times a day (BID) | ORAL | Status: DC
Start: 2014-09-23 — End: 2014-09-23
  Administered 2014-09-23: 81 mg via ORAL
  Filled 2014-09-23: qty 1

## 2014-09-23 MED ORDER — POLYETHYLENE GLYCOL 3350 17 G PO PACK
17.0000 g | PACK | Freq: Every day | ORAL | Status: DC
  Administered 2014-09-23: 17 g via ORAL
  Filled 2014-09-23: qty 1

## 2014-09-23 MED ORDER — CELECOXIB 100 MG PO CAPS
100.0000 mg | ORAL_CAPSULE | Freq: Two times a day (BID) | ORAL | Status: DC | PRN
Start: 2014-09-23 — End: 2014-09-23
  Administered 2014-09-23: 100 mg via ORAL
  Filled 2014-09-23 (×3): qty 1

## 2014-09-23 MED ORDER — ENSURE CLINICAL ST REVIGOR PO LIQD: 237.0000 mL | Freq: Three times a day (TID) | ORAL | Status: DC

## 2014-09-23 MED ORDER — ENOXAPARIN SODIUM 30 MG/0.3ML ~~LOC~~ SOLN
30.0000 mg | SUBCUTANEOUS | Status: DC
  Filled 2014-09-23: qty 0.3

## 2014-09-23 MED ORDER — LEVOTHYROXINE SODIUM 75 MCG PO TABS
75.0000 ug | ORAL_TABLET | Freq: Every day | ORAL | Status: DC
  Filled 2014-09-23: qty 1

## 2014-09-23 MED ORDER — AMLODIPINE BESYLATE 10 MG PO TABS
10.0000 mg | ORAL_TABLET | Freq: Every day | ORAL | Status: DC
  Administered 2014-09-23: 10 mg via ORAL
  Filled 2014-09-23: qty 1

## 2014-09-23 NOTE — Progress Notes (Signed)
PT Cancellation Note  Patient Details Name: Markham JordanMargaret F Tortora MRN: [id number] DOB: [birth year]   Cancelled Treatment:    Reason Eval/Treat Not Completed: Patient not medically ready Pt on strict bedrest until 7:12pm. PT will hold until pt medically ready. MD please update activity orders when appropriate for therapy, Thank you.   [facility] Dba Dearborn Surgery CenterWILLIAMS,Deondria Puryear 09/23/2014, 9:31 AM

## 2014-09-23 NOTE — Discharge Summary (Addendum)
Name: Kathy Horton MRN: [id number] DOB: 1922/04/22 81 y.o. PCP: Baltazar Apo, MD  Date of Admission: 09/22/2014  8:31 AM Date of Discharge: 09/23/2014 Attending Physician: Burns Spain, MD  Discharge Diagnosis:  Principal Problem:   Acute encephalopathy Active Problems:   Glaucoma   HTN (hypertension)   Hypothyroidism   Osteopenia   CVA, old, dysphagia   At high risk for falls   Severe protein-calorie malnutrition   Elevated fasting glucose  Discharge Medications:   Medication List    TAKE these medications        acetaminophen 325 MG tablet  Commonly known as:  TYLENOL  Take 325 mg by mouth every 6 (six) hours as needed for moderate pain.     amLODipine 10 MG tablet  Commonly known as:  NORVASC  TAKE 1 TABLET EVERY DAY     aspirin 81 MG tablet  Take 81 mg by mouth 2 (two) times daily.     celecoxib 100 MG capsule  Commonly known as:  CELEBREX  Take 1 capsule (100 mg total) by mouth 2 (two) times daily as needed.     diclofenac sodium 1 % Gel  Commonly known as:  VOLTAREN  Apply 2 g topically 4 (four) times daily as needed.     docusate sodium 100 MG capsule  Commonly known as:  COLACE  Take 1 capsule (100 mg total) by mouth 2 (two) times daily as needed.     feeding supplement Liqd  Take 237 mLs by mouth 3 (three) times daily between meals.     hydrALAZINE 25 MG tablet  Commonly known as:  APRESOLINE  TAKE 1 TABLET (25 MG TOTAL) BY MOUTH 2 (TWO) TIMES DAILY.     levothyroxine 50 MCG tablet  Commonly known as:  SYNTHROID, LEVOTHROID  Take 1.5 tablets (75 mcg total) by mouth daily before breakfast.     omeprazole 40 MG capsule  Commonly known as:  PRILOSEC  Take 1 capsule (40 mg total) by mouth daily.     polyethylene glycol packet  Commonly known as:  MIRALAX / GLYCOLAX  Take 17 g by mouth daily.        Disposition and follow-up:   Kathy Horton was discharged from [facility] in Stable condition.   At the hospital follow up visit please address:  1.  Malnutrition and adequate oral intake with dysphagia, improved strength working with PT and OT, complete treatment for UTI.  2.  Labs / imaging needed at time of follow-up: TSH in 6 weeks.  3.  Pending labs/ test needing follow-up: HIV antibody, Vit D25, Hgb A1c  Follow-up Appointments: Follow-up Information    Follow up with Christen Bame, MD On 10/06/2014.   Specialty:  Internal Medicine   Why:  9:15 am   Contact information:   [address] [place] [address] [phone]       Discharge Instructions: Discharge Instructions    Call MD for:  difficulty breathing, headache or visual disturbances    Complete by:  As directed      Call MD for:  extreme fatigue    Complete by:  As directed      Call MD for:  persistant dizziness or light-headedness    Complete by:  As directed      Call MD for:  persistant nausea and vomiting    Complete by:  As directed      Call MD for:  redness, tenderness, or signs  of infection (pain, swelling, redness, odor or green/yellow discharge around incision site)    Complete by:  As directed      Call MD for:  temperature >100.4    Complete by:  As directed      Diet - low sodium heart healthy    Complete by:  As directed      Increase activity slowly    Complete by:  As directed            Thank you for allowing [place] to be involved in your healthcare while you were hospitalized at [facility].   Please note that there have been changes to your home medications.  --> PLEASE LOOK AT YOUR DISCHARGE MEDICATION LIST FOR DETAILS.   Please call your PCP if you have any questions or concerns, or any difficulty getting any of your medications.  Please return to the ER if you have worsening of your symptoms or new severe symptoms arise.  Consultations: None.  Procedures Performed:  Dg Chest 1 View  09/22/2014   CLINICAL DATA:  Altered mental status  EXAM: CHEST  1 VIEW   COMPARISON:  08/12/2014  FINDINGS: Pulmonary hyperinflation and interstitial coarsening, stable and likely COPD. Heart size and aortic contours are within normal limits. There is no edema, consolidation, effusion, or pneumothorax. No acute osseous findings.  IMPRESSION: 1.  No active disease. 2. COPD.   Electronically Signed   By: Marnee Spring M.D.   On: 09/22/2014 10:30   Ct Head Wo Contrast  09/22/2014   CLINICAL DATA:  Confusion for 1 week with possible fall. Initial encounter.  EXAM: CT HEAD WITHOUT CONTRAST  CT CERVICAL SPINE WITHOUT CONTRAST  TECHNIQUE: Multidetector CT imaging of the head and cervical spine was performed following the standard protocol without intravenous contrast. Multiplanar CT image reconstructions of the cervical spine were also generated.  COMPARISON:  06/11/2013 neck CT.  10/16/2011 head CT  FINDINGS: CT HEAD FINDINGS  Skull and Sinuses:Negative for fracture or destructive process. The mastoids, middle ears, and imaged paranasal sinuses are clear.  Orbits: No acute abnormality.  Brain: No evidence of acute infarction, hemorrhage, hydrocephalus, or mass lesion/mass effect. Advanced generalized brain atrophy which has mildly progressed from 2013. There is chronic small vessel disease with ischemic gliosis confluent throughout the deep cerebral white matter.  CT CERVICAL SPINE FINDINGS  Negative for acute fracture or subluxation. Cervical spine alignment is stable from 2014 neck CT. No prevertebral edema. No gross cervical canal hematoma. Diffuse degenerative disc disease, with disc narrowing maximal at C5-6 and C6-7. No significant osseous canal or foraminal stenosis.  A cystic mass in the left neck, deep to the sternocleidomastoid is essentially stable at 5 x 2.3 cm maximal axial dimension. Given relative stability, this is presumably a benign cyst.  IMPRESSION: 1. No evidence of intracranial or cervical spine injury. 2. Brain atrophy and chronic small vessel disease. 3. 5 cm  cystic mass in the left neck, benign given relative stability since 2014.   Electronically Signed   By: Marnee Spring M.D.   On: 09/22/2014 11:22   Ct Cervical Spine Wo Contrast  09/22/2014   CLINICAL DATA:  Confusion for 1 week with possible fall. Initial encounter.  EXAM: CT HEAD WITHOUT CONTRAST  CT CERVICAL SPINE WITHOUT CONTRAST  TECHNIQUE: Multidetector CT imaging of the head and cervical spine was performed following the standard protocol without intravenous contrast. Multiplanar CT image reconstructions of the cervical spine were also generated.  COMPARISON:  06/11/2013 neck CT.  10/16/2011 head CT  FINDINGS: CT HEAD FINDINGS  Skull and Sinuses:Negative for fracture or destructive process. The mastoids, middle ears, and imaged paranasal sinuses are clear.  Orbits: No acute abnormality.  Brain: No evidence of acute infarction, hemorrhage, hydrocephalus, or mass lesion/mass effect. Advanced generalized brain atrophy which has mildly progressed from 2013. There is chronic small vessel disease with ischemic gliosis confluent throughout the deep cerebral white matter.  CT CERVICAL SPINE FINDINGS  Negative for acute fracture or subluxation. Cervical spine alignment is stable from 2014 neck CT. No prevertebral edema. No gross cervical canal hematoma. Diffuse degenerative disc disease, with disc narrowing maximal at C5-6 and C6-7. No significant osseous canal or foraminal stenosis.  A cystic mass in the left neck, deep to the sternocleidomastoid is essentially stable at 5 x 2.3 cm maximal axial dimension. Given relative stability, this is presumably a benign cyst.  IMPRESSION: 1. No evidence of intracranial or cervical spine injury. 2. Brain atrophy and chronic small vessel disease. 3. 5 cm cystic mass in the left neck, benign given relative stability since 2014.   Electronically Signed   By: Marnee SpringJonathon  Watts M.D.   On: 09/22/2014 11:22   Dg Hips Bilat With Pelvis 2v  09/22/2014   CLINICAL DATA:  81 year old  female with left hip pain following a fall  EXAM: BILATERAL HIP (WITH PELVIS) 2 VIEWS  COMPARISON:  Concurrently obtained chest x-ray  FINDINGS: The bones are diffusely osteopenic. No evidence of acute fracture or malalignment. The visualized bony pelvis appears intact. Atherosclerotic vascular calcifications are noted bilaterally in the common and superficial femoral arteries. No lytic or blastic osseous lesion.  IMPRESSION: No acute fracture or malalignment identified. Of note, the bones are profoundly osteopenic which can limit evaluation for subtle, nondisplaced fracture. If there is persistent clinical concern, recommend further evaluation with MRI of the pelvis as it is significantly more sensitive than CT in setting of osteopenia.  Atherosclerotic vascular calcifications.   Electronically Signed   By: Malachy MoanHeath  McCullough M.D.   On: 09/22/2014 10:31   Admission HPI:  Kathy Horton is a 81yo woman with PMHx of HTN, CVA, anemia, malnutrition, and hypothyroidism who presented to the ED with altered mental status. Per ED note, patient reportedly had gradual onset and worsening of persistent "confusion" for the past week. However, patient is alert and oriented to person and place on exam, though she is very agitated. She states she has fallen several times in the last few weeks at home. She lives at home by herself. She reports yesterday she fell backwards and hit her head. She denies losing consciousness. She denies any pain at this time. She reports poor appetite. She denies any fever, chills, urinary symptoms, cough, congestion, or abdominal pain.   Hospital Course by problem list: Principal Problem:   Acute encephalopathy Active Problems:   Glaucoma   HTN (hypertension)   Hypothyroidism   Osteopenia   CVA, old, dysphagia   At high risk for falls   Severe protein-calorie malnutrition   Elevated fasting glucose   #Acute encephalopathy Mrs. Janee Horton presented with gradual onset persistent confusion  over the previous week associated with several falls in the last few weeks at home. She lives by herself and was brought in by her family. She was initially very agitated in the ER, and she was oriented to person and place but not time. She was noted to be very dry on exam, she is giving intravenous fluids overnight. In addition, her TSH  was noted to be high, and her Synthroid dose was increased to 75 g daily. No evidence of infection or electrolyte abnormality was found, and toxicology screening was negative. CT scan of her head along with chest x-ray and bilateral hip x-rays showed no evidence of fracture, bleed, or stroke. She was much improved the morning following admission with decreased agitation. She remained oriented to person and place but not time. Given her malnutrition and living situation, it was felt that she would benefit from going to a skilled nursing facility. This was discussed with the patient who agreed, and she was discharged the afternoon following admission.  #Malnutrition She has had a history of dysphagia that has been worked up in the past and showed structural dysphagia. She has had poor oral intake at home, and she has been supplementing her meals with Ensure. She was given a soft diet and continued on her home Ensure. She'll need close follow-up to ensure adequate by mouth intake. She'll be able to work with PT and OT at the skilled nursing facility.  #Urinary tract infection She complained of some abdominal pain on the day of her discharge. Her urine culture was noted to grow 80,000 colonies of Escherichia coli. She was discharged with a prescription for Bactrim, which she should continue for 5 days to treat her UTI.  #Hypothyroidism As noted above, her Synthroid dose was increased to 75 g daily. She will need a repeat TSH in 6 weeks for dose titration.  #Hypertension Her blood pressure was noted to be elevated to 214/97, which was thought to be related to her agitation.  She was controlled initially with IV antihypertensives, and switched to her home antihypertensive medications prior to discharge.  #Chronic back pain Mrs. Dowson complained of some lower back pain, which she says is chronic for her. She also had some facial pain from her falls. Head CT showed no signs of fracture. She is continued on her home Tylenol and celecoxib along with topical Voltaren gel to control her pain.  Discharge Vitals:   BP 152/60 mmHg  Pulse 56  Temp(Src) 97.6 F (36.4 C) (Oral)  Resp 15  Ht  (1.575 m)  Wt 70 lb (31.752 kg)  BMI 12.80 kg/m2  SpO2 100%  Discharge Labs:  Results for orders placed or performed during the hospital encounter of 09/22/14 (from the past 24 hour(s))  Urine rapid drug screen (hosp performed)     Status: None   Collection Time: 09/22/14  7:27 PM  Result Value Ref Range   Opiates NONE DETECTED NONE DETECTED   Cocaine NONE DETECTED NONE DETECTED   Benzodiazepines NONE DETECTED NONE DETECTED   Amphetamines NONE DETECTED NONE DETECTED   Tetrahydrocannabinol NONE DETECTED NONE DETECTED   Barbiturates NONE DETECTED NONE DETECTED  Acetaminophen level     Status: Abnormal   Collection Time: 09/22/14  7:40 PM  Result Value Ref Range   Acetaminophen (Tylenol), Serum <10.0 (L) 10 - 30 ug/mL  Salicylate level     Status: None   Collection Time: 09/22/14  7:40 PM  Result Value Ref Range   Salicylate Lvl <4.0 2.8 - 20.0 mg/dL  MRSA PCR Screening     Status: None   Collection Time: 09/22/14  8:17 PM  Result Value Ref Range   MRSA by PCR NEGATIVE NEGATIVE  Troponin I     Status: None   Collection Time: 09/22/14  8:45 PM  Result Value Ref Range   Troponin I 0.03 <0.031 ng/mL  TSH  Status: Abnormal   Collection Time: 09/22/14  8:45 PM  Result Value Ref Range   TSH 7.179 (H) 0.350 - 4.500 uIU/mL  Magnesium     Status: None   Collection Time: 09/22/14  8:45 PM  Result Value Ref Range   Magnesium 2.0 1.5 - 2.5 mg/dL  Phosphorus      Status: None   Collection Time: 09/22/14  8:45 PM  Result Value Ref Range   Phosphorus 3.1 2.3 - 4.6 mg/dL  Lactic acid, plasma     Status: None   Collection Time: 09/22/14  8:45 PM  Result Value Ref Range   Lactic Acid, Venous 0.7 0.5 - 2.0 mmol/L  Protime-INR     Status: None   Collection Time: 09/22/14  8:45 PM  Result Value Ref Range   Prothrombin Time 12.7 11.6 - 15.2 seconds   INR 0.95 0.00 - 1.49  Ethanol     Status: None   Collection Time: 09/22/14  8:45 PM  Result Value Ref Range   Alcohol, Ethyl (B) <5 0 - 9 mg/dL  Vitamin R60     Status: None   Collection Time: 09/22/14  8:45 PM  Result Value Ref Range   Vitamin B-12 406 211 - 911 pg/mL  Folate     Status: None   Collection Time: 09/22/14  8:45 PM  Result Value Ref Range   Folate 17.2 ng/mL  Lipase, blood     Status: None   Collection Time: 09/22/14  8:45 PM  Result Value Ref Range   Lipase 36 11 - 59 U/L  Basic metabolic panel     Status: Abnormal   Collection Time: 09/23/14  6:53 AM  Result Value Ref Range   Sodium 143 135 - 145 mmol/L   Potassium 3.8 3.5 - 5.1 mmol/L   Chloride 110 96 - 112 mmol/L   CO2 23 19 - 32 mmol/L   Glucose, Bld 62 (L) 70 - 99 mg/dL   BUN 10 6 - 23 mg/dL   Creatinine, Ser 4.54 0.50 - 1.10 mg/dL   Calcium 9.3 8.4 - 09.8 mg/dL   GFR calc non Af Amer 75 (L) >90 mL/min   GFR calc Af Amer 87 (L) >90 mL/min   Anion gap 10 5 - 15    Signed: Luisa Dago, MD 09/23/2014, 1:27 PM    Services Ordered on Discharge: SNF. Equipment Ordered on Discharge: None.

## 2014-09-23 NOTE — Progress Notes (Addendum)
Subjective:    Currently, the patient reports doing well and is very pleasant. She is oriented to person and place but not time. She reports some chronic back pain and face pain from recent falls. Discussed possible discharge to a skilled nursing facility. She was amenable to this, and she thinks would be a good idea to help rebuild her strength.  Interval Events: -Rehydrated with IV fluids. -Vital signs stable with blood pressure improved after receiving IV antihypertensives. -TSH elevated.    Objective:    Vital Signs:   Temp:  [97.6 F (36.4 C)-97.7 F (36.5 C)] 97.6 F (36.4 C) (02/11 0701) Pulse Rate:  [56-95] 56 (02/11 0701) Resp:  [10-22] 15 (02/11 0701) BP: (152-214)/(60-97) 152/60 mmHg (02/11 0701) SpO2:  [97 %-100 %] 100 % (02/11 0701) Weight:  [70 lb (31.752 kg)] 70 lb (31.752 kg) (02/10 1547) Last BM Date: 09/21/14  24-hour weight change: Weight change:   Intake/Output:   Intake/Output Summary (Last 24 hours) at 09/23/14 1033 Last data filed at 09/23/14 0700  Gross per 24 hour  Intake 1232.5 ml  Output    250 ml  Net  982.5 ml      Physical Exam: General: Cachectic, oriented to person and place but not time. Eyes closed while talking, poor vision in both eyes.   Lungs:  Normal respiratory effort. Clear to auscultation BL without crackles or wheezes.  Heart: RRR. S1 and S2 normal without gallop, murmur, or rubs.  Abdomen:  BS normoactive. Soft, Nondistended, non-tender.  No masses or organomegaly.  Extremities: No pretibial edema.     Labs:  Basic Metabolic Panel:  Recent Labs Lab 09/22/14 0845 09/22/14 2045 09/23/14 0653  NA 144  --  143  K 4.9  --  3.8  CL 109  --  110  CO2 25  --  23  GLUCOSE 101*  --  62*  BUN 11  --  10  CREATININE 0.67  --  0.66  CALCIUM 10.0  --  9.3  MG  --  2.0  --   PHOS  --  3.1  --     Liver Function Tests:  Recent Labs Lab 09/22/14 0845  AST 30  ALT 13  ALKPHOS 34*  BILITOT 1.2  PROT 8.0  ALBUMIN  4.4    Recent Labs Lab 09/22/14 2045  LIPASE 36   CBC:  Recent Labs Lab 09/22/14 0845  WBC 8.0  HGB 12.1  HCT 37.6  MCV 97.2  PLT 306    Cardiac Enzymes:  Recent Labs Lab 09/22/14 0845 09/22/14 1150 09/22/14 2045  TROPONINI 0.06* <0.03 0.03    Microbiology: Results for orders placed or performed during the hospital encounter of 09/22/14  MRSA PCR Screening     Status: None   Collection Time: 09/22/14  8:17 PM  Result Value Ref Range Status   MRSA by PCR NEGATIVE NEGATIVE Final    Comment:        The GeneXpert MRSA Assay (FDA approved for NASAL specimens only), is one component of a comprehensive MRSA colonization surveillance program. It is not intended to diagnose MRSA infection nor to guide or monitor treatment for MRSA infections.     Coagulation Studies:  Recent Labs  09/22/14 2045  LABPROT 12.7  INR 0.95    Imaging: Dg Chest 1 View  09/22/2014   CLINICAL DATA:  Altered mental status  EXAM: CHEST  1 VIEW  COMPARISON:  08/12/2014  FINDINGS: Pulmonary hyperinflation and interstitial coarsening, stable and  likely COPD. Heart size and aortic contours are within normal limits. There is no edema, consolidation, effusion, or pneumothorax. No acute osseous findings.  IMPRESSION: 1.  No active disease. 2. COPD.   Electronically Signed   By: Marnee Spring M.D.   On: 09/22/2014 10:30   Ct Head Wo Contrast  09/22/2014   CLINICAL DATA:  Confusion for 1 week with possible fall. Initial encounter.  EXAM: CT HEAD WITHOUT CONTRAST  CT CERVICAL SPINE WITHOUT CONTRAST  TECHNIQUE: Multidetector CT imaging of the head and cervical spine was performed following the standard protocol without intravenous contrast. Multiplanar CT image reconstructions of the cervical spine were also generated.  COMPARISON:  06/11/2013 neck CT.  10/16/2011 head CT  FINDINGS: CT HEAD FINDINGS  Skull and Sinuses:Negative for fracture or destructive process. The mastoids, middle ears, and  imaged paranasal sinuses are clear.  Orbits: No acute abnormality.  Brain: No evidence of acute infarction, hemorrhage, hydrocephalus, or mass lesion/mass effect. Advanced generalized brain atrophy which has mildly progressed from 2013. There is chronic small vessel disease with ischemic gliosis confluent throughout the deep cerebral white matter.  CT CERVICAL SPINE FINDINGS  Negative for acute fracture or subluxation. Cervical spine alignment is stable from 2014 neck CT. No prevertebral edema. No gross cervical canal hematoma. Diffuse degenerative disc disease, with disc narrowing maximal at C5-6 and C6-7. No significant osseous canal or foraminal stenosis.  A cystic mass in the left neck, deep to the sternocleidomastoid is essentially stable at 5 x 2.3 cm maximal axial dimension. Given relative stability, this is presumably a benign cyst.  IMPRESSION: 1. No evidence of intracranial or cervical spine injury. 2. Brain atrophy and chronic small vessel disease. 3. 5 cm cystic mass in the left neck, benign given relative stability since 2014.   Electronically Signed   By: Marnee Spring M.D.   On: 09/22/2014 11:22   Ct Cervical Spine Wo Contrast  09/22/2014   CLINICAL DATA:  Confusion for 1 week with possible fall. Initial encounter.  EXAM: CT HEAD WITHOUT CONTRAST  CT CERVICAL SPINE WITHOUT CONTRAST  TECHNIQUE: Multidetector CT imaging of the head and cervical spine was performed following the standard protocol without intravenous contrast. Multiplanar CT image reconstructions of the cervical spine were also generated.  COMPARISON:  06/11/2013 neck CT.  10/16/2011 head CT  FINDINGS: CT HEAD FINDINGS  Skull and Sinuses:Negative for fracture or destructive process. The mastoids, middle ears, and imaged paranasal sinuses are clear.  Orbits: No acute abnormality.  Brain: No evidence of acute infarction, hemorrhage, hydrocephalus, or mass lesion/mass effect. Advanced generalized brain atrophy which has mildly  progressed from 2013. There is chronic small vessel disease with ischemic gliosis confluent throughout the deep cerebral white matter.  CT CERVICAL SPINE FINDINGS  Negative for acute fracture or subluxation. Cervical spine alignment is stable from 2014 neck CT. No prevertebral edema. No gross cervical canal hematoma. Diffuse degenerative disc disease, with disc narrowing maximal at C5-6 and C6-7. No significant osseous canal or foraminal stenosis.  A cystic mass in the left neck, deep to the sternocleidomastoid is essentially stable at 5 x 2.3 cm maximal axial dimension. Given relative stability, this is presumably a benign cyst.  IMPRESSION: 1. No evidence of intracranial or cervical spine injury. 2. Brain atrophy and chronic small vessel disease. 3. 5 cm cystic mass in the left neck, benign given relative stability since 2014.   Electronically Signed   By: Marnee Spring M.D.   On: 09/22/2014 11:22   Dg  Hips Bilat With Pelvis 2v  09/22/2014   CLINICAL DATA:  81 year old female with left hip pain following a fall  EXAM: BILATERAL HIP (WITH PELVIS) 2 VIEWS  COMPARISON:  Concurrently obtained chest x-ray  FINDINGS: The bones are diffusely osteopenic. No evidence of acute fracture or malalignment. The visualized bony pelvis appears intact. Atherosclerotic vascular calcifications are noted bilaterally in the common and superficial femoral arteries. No lytic or blastic osseous lesion.  IMPRESSION: No acute fracture or malalignment identified. Of note, the bones are profoundly osteopenic which can limit evaluation for subtle, nondisplaced fracture. If there is persistent clinical concern, recommend further evaluation with MRI of the pelvis as it is significantly more sensitive than CT in setting of osteopenia.  Atherosclerotic vascular calcifications.   Electronically Signed   By: Malachy Moan M.D.   On: 09/22/2014 10:31       Medications:    Infusions:    Scheduled Medications: . sodium chloride    Intravenous STAT  . enoxaparin (LOVENOX) injection  40 mg Subcutaneous Q24H  . levothyroxine  50 mcg Intravenous Daily  . pantoprazole (PROTONIX) IV  40 mg Intravenous Q24H    PRN Medications: acetaminophen **OR** acetaminophen, diclofenac sodium, metoprolol   Assessment/ Plan:    Principal Problem:   Acute encephalopathy Active Problems:   Glaucoma   HTN (hypertension)   Hypothyroidism   Osteopenia   CVA, old, dysphagia   At high risk for falls   Severe protein-calorie malnutrition   Elevated fasting glucose  #Acute encephalopathy Patient less agitated today, and she appears to be at her baseline. She continues to have no evidence of infection or electrolyte abnormalities to explain her encephalopathy. Acetaminophen and salicylate levels normal. Her TSH was slightly high, but this would seem unlikely to be the cause of her altered mental status. She is likely dehydrated and malnourished, and she has improved with rehydration. Discussion of possible hospice has been brought up in the past. -Continue normal saline at 75 mL per hour while diet restarted. -Increase Synthroid to 75 g daily. -Consult to social work regarding skilled nursing facility placement. -PT and OT consulted. -Ambulate patient with nursing and PT. -Fall precautions. -Consider palliative care consult.  #Malnutrition History of dysphagia in the past, which has been worked up previously. Given her advanced age will not pursue a swallowing study. -Start soft diet with ensure. -Restart home Protonix 40 mg daily.  #Hypertension Blood pressure continues to be mildly elevated this morning on IV medications. -Restart home amlodipine 10 mg daily, hydralazine 25 mg twice a day.  #Hypothyroidism TSH elevated as above. -Check free T4. -Increased Synthroid from 50 g to 75 g daily.  #Chronic back pain from falls She reports back pain which she says has been chronic for her. She also has some pain above her nose  from her fall. CT head showed no evidence of fracture. -Continue Tylenol 650 mg every 6 hours as needed for pain. -Restart home celecoxib 100 mg twice a day when necessary. -Continue home Voltaren gel 4 times daily when necessary.  #History of CVA -Restart home aspirin 81 mg daily.  #Constipation -Restart at home MiraLAX daily and Colace twice a day when necessary.   DVT PPX - low molecular weight heparin  CODE STATUS - Full.  CONSULTS PLACED - None.  DISPO - Disposition is deferred at this time, awaiting possible SNF placement.   Anticipated discharge in approximately 1-2 day(s).   The patient does have a current PCP Virgina Organ, Lindalou Hose, MD) and [facility]  [facility] after discharge.    [facility] a one-time only appointment? no.  Does the patient have transportation limitations that hinder transportation to clinic appointments? yes   SERVICE NEEDED AT DISCHARGE - TO BE DETERMINED DURING HOSPITAL COURSE         Y = Yes, Blank = No PT:   OT:   RN:   Equipment:   Other:      Length of Stay:  day(s)   Signed: Luisa DagoEverett Shaine Newmark, MD  PGY-1, Internal Medicine Resident Pager: [phone] (7AM-5PM) 09/23/2014, 10:33 AM     --Addendum-- Luisa DagoEverett Sosha Shepherd, MD, PhD Internal Medicine Intern Pager: [phone] 09/23/2014,3:57 PM  Placement was arranged and the skilled nursing facility this afternoon. She'll be discharged today as she has no other medical problems including treating. Her urine culture grew 80,000 colonies of Escherichia coli. On repeat questioning, she did complain of some abdominal pain. -Wrote prescription for Bactrim DS every 12 hours for 5 days to initiate on discharge.

## 2014-09-23 NOTE — Discharge Instructions (Addendum)
·   Thank you for allowing us to be involved in your healthcare while you were hospitalized [facility].   Please note that there have been changes to your home medications.  --> PLEASE LOOK AT YOUR DISCHARGE MEDICATION LIST FOR DETAILS.   Please call your PCP if you have any questions or concerns, or any difficulty getting any of your medications.  Please return to the ER if you have worsening of your symptoms or new severe symptoms arise.  You have a urinary tract infection.  You will need to take Bactrim twice a day for 5 days to treat the infection.

## 2014-09-23 NOTE — Clinical Social Work Placement (Signed)
Clinical Social Work Department CLINICAL SOCIAL WORK PLACEMENT NOTE 09/23/2014  Patient:  Kathy Horton,Kathy Horton  Account Number:  [id number] Admit date:  09/22/2014  Clinical Social Worker:  Ashley JacobsHANNAH NAIL, [place]  Date/time:  09/22/2014 11:57 AM  Clinical Social Work is seeking post-discharge placement for this patient at the following level of care:   SKILLED NURSING   (*CSW will update this form in Epic as items are completed)   09/22/2014  Patient/family provided with [facility] Department of Clinical Social Work's list of facilities offering this level of care within the geographic area requested by the patient (or if unable, by the patient's family).  09/22/2014  Patient/family informed of their freedom to choose among providers that offer the needed level of care, that participate in Medicare, Medicaid or managed care program needed by the patient, have an available bed and are willing to accept the patient.  09/22/2014  Patient/family informed of [facility]' ownership interest in [facility], as well as of the fact that they are under no obligation to receive care at this facility.  PASARR submitted to EDS on 09/22/2014 PASARR number received on 09/22/2014  FL2 transmitted to all facilities in geographic area requested by pt/family on  09/22/2014 FL2 transmitted to all facilities within larger geographic area on   Patient informed that his/her managed care company has contracts with or will negotiate with  certain facilities, including the following:     Patient/family informed of bed offers received:  09/23/2014 Patient chooses bed at  Physician recommends and patient chooses bed at    [facility] [place] on  09/23/2014 Patient to be transferred to facility by Ambulance Patient and family notified of transfer on 09/23/2014 Name of family member notified:  Jonna Coupufus  The following physician request were entered in  Epic: Physician Request  Please sign FL2.    Additional Comments:  Per MD patient ready for [facility] [place]. RN, patient, patient's family, and facility aware of DC. Patient to be transported to facility by ambulance. DC Packet on chart along with number for report. BSW intern signing off at this time.  Stacy GardnerErin Hady Niemczyk, BSW Intern, [phone]

## 2014-09-23 NOTE — Evaluation (Addendum)
Physical Therapy Evaluation Patient Details Name: Kathy Horton MRN: [id number] DOB: 06-17-1922 Today's Date: 09/23/2014   History of Present Illness  Kathy Horton is a 81yo woman adm 09/22/14 with AMS and fall at home;  PMHx of HTN, CVA, anemia, malnutrition, and hypothyroidism   Clinical Impression  Pt will benefit from PT to address deficits below; Will need SNF level therapies post acute, will continue to follow;    Follow Up Recommendations SNF    Equipment Recommendations  None recommended by PT    Recommendations for Other Services       Precautions / Restrictions Precautions Precautions: Fall;Other (comment) Precaution Comments: pt is blind      Mobility  Bed Mobility Overal bed mobility: Needs Assistance Bed Mobility: Supine to Sit     Supine to sit: Mod assist     General bed mobility comments: cues for direction, self assist  Transfers Overall transfer level: Needs assistance Equipment used: Rolling walker (2 wheeled) Transfers: Sit to/from Stand Sit to Stand: Min assist         General transfer comment: tactile adn verbal cues  for hand placement and safety; pt is restless, intermittently confused and requires frequent redirection to task   Ambulation/Gait Ambulation/Gait assistance: Min assist;Mod assist Ambulation Distance (Feet): 10 Feet Assistive device: Rolling walker (2 wheeled) Gait Pattern/deviations: Narrow base of support;Step-through pattern;Decreased stride length     General Gait Details: assist to maneuver walker and for stability, pt requries constant verbal and tactile cues d/t blindness and confusion  Stairs            Wheelchair Mobility    Modified Rankin (Stroke Patients Only)       Balance Overall balance assessment: Needs assistance Sitting-balance support: Feet unsupported;No upper extremity supported Sitting balance-Leahy Scale: Fair Sitting balance - Comments: occasional min assist Postural control:  Posterior lean   Standing balance-Leahy Scale: Poor Standing balance comment: mod assist to maintain standing                             Pertinent Vitals/Pain Pain Assessment: 0-10 Pain Score: 3  Pain Location: stomach Pain Descriptors / Indicators: Cramping Pain Intervention(s): Limited activity within patient's tolerance;Monitored during session;Repositioned    Home Living Family/patient expects to be discharged to:: Skilled nursing facility  pt reports she lives with her son and he works at [employer] of accuracy of this                    Prior Function                 Higher education careers adviser        Extremity/Trunk Assessment   Upper Extremity Assessment: Defer to OT evaluation           Lower Extremity Assessment: Generalized weakness         Communication   Communication: No difficulties  Cognition Arousal/Alertness: Awake/alert Behavior During Therapy: Restless Overall Cognitive Status: Impaired/Different from baseline Area of Impairment: Orientation;Following commands;Safety/judgement;Problem solving;Attention Orientation Level: Disoriented to;Place;Situation Current Attention Level: Sustained Memory: Decreased short-term memory Following Commands: Follows one step commands inconsistently Safety/Judgement: Decreased awareness of safety;Decreased awareness of deficits   Problem Solving: Slow processing;Difficulty sequencing;Requires verbal cues;Requires tactile cues General Comments: pt asking about time of day (due to blindness); pt quite confused and able to remain oriented for a few brief seconds; able to understand that the environment was different here, then would become confused again  and  have difficulty following directional cues, insisting it was " not that way"    General Comments      Exercises        Assessment/Plan    PT Assessment Patient needs continued PT services  PT Diagnosis Difficulty walking;Generalized  weakness   PT Problem List Decreased strength;Decreased activity tolerance;Decreased balance;Decreased mobility;Decreased safety awareness  PT Treatment Interventions DME instruction;Gait training;Functional mobility training;Therapeutic activities;Patient/family education;Therapeutic exercise   PT Goals (Current goals can be found in the Care Plan section) Acute Rehab PT Goals Patient Stated Goal: pt unable to state PT Goal Formulation: Patient unable to participate in goal setting Time For Goal Achievement: 10/07/14 Potential to Achieve Goals: Good    Frequency Min 3X/week   Barriers to discharge Decreased caregiver support      Co-evaluation               End of Session Equipment Utilized During Treatment: Gait belt Activity Tolerance: Patient limited by fatigue Patient left: in chair;with call bell/phone within reach;with chair alarm set Nurse Communication: Mobility status    Functional Assessment Tool Used: clinical judgement Functional Limitation: Mobility: Walking and moving around Mobility: Walking and Moving Around Current Status [phone](G8978): At least 20 percent but less than 40 percent impaired, limited or restricted Mobility: Walking and Moving Around Goal Status [phone](G8979): At least 1 percent but less than 20 percent impaired, limited or restricted    Time: [id number] PT Time Calculation (min) (ACUTE ONLY): 27 min   Charges:         PT G Codes:   PT G-Codes **NOT FOR INPATIENT CLASS** Functional Assessment Tool Used: clinical judgement Functional Limitation: Mobility: Walking and moving around Mobility: Walking and Moving Around Current Status ([id number]): At least 20 percent but less than 40 percent impaired, limited or restricted Mobility: Walking and Moving Around Goal Status [phone](G8979): At least 1 percent but less than 20 percent impaired, limited or restricted    [facility]ILLIAMS,Chiyo Fay 09/23/2014, 1:46 PM

## 2014-09-23 NOTE — Progress Notes (Signed)
[facility]  Will continue to monitor pt until transport arrives.

## 2014-09-23 NOTE — Evaluation (Signed)
Occupational Therapy Evaluation Patient Details Name: Kathy Horton MRN: [id number] DOB: 1921/10/29 Today's Date: 09/23/2014    History of Present Illness Kathy Horton is a 81yo woman adm 09/22/14 with AMS and fall at home;  PMHx of HTN, CVA, anemia, malnutrition, and hypothyroidism    Clinical Impression   PTA pt lived at home at reports that she was independent, however unable to determine accuracy of report due to AMS. Pt requires assist due to decreased cognition and unfamiliar environment (pt is blind). Pt presents with generalized weakness and requires assistance for ADLs. Pt will benefit from acute OT to address toilet transfers and ADLs and will benefit from SNF at d/c.     Follow Up Recommendations  SNF;Supervision/Assistance - 24 hour    Equipment Recommendations  None recommended by OT    Recommendations for Other Services       Precautions / Restrictions Precautions Precautions: Fall;Other (comment) Precaution Comments: pt is blind Restrictions Weight Bearing Restrictions: No      Mobility Bed Mobility Overal bed mobility: Needs Assistance Bed Mobility: Supine to Sit;Sit to Supine     Supine to sit: Mod assist Sit to supine: Min assist   General bed mobility comments: cues for direction, assist to elevate trunk  Transfers Overall transfer level: Needs assistance Equipment used: 1 person hand held assist Transfers: Sit to/from [employer] Sit to Stand: Min assist Stand pivot transfers: Min assist       General transfer comment: tactile and verbal cues for hand placement and safety.          ADL Overall ADL's : Needs assistance/impaired Eating/Feeding: Set up;Sitting   Grooming: Set up;Sitting   Upper Body Bathing: Minimal assitance;Sitting   Lower Body Bathing: Minimal assistance;Sit to/from stand   Upper Body Dressing : Minimal assistance;Sitting   Lower Body Dressing: Moderate assistance;Sit to/from stand    Toilet Transfer: Minimal assistance;Stand-pivot;BSC   Toileting- Clothing Manipulation and Hygiene: Minimal assistance;Sit to/from stand         General ADL Comments: Pt is blind and likely limited by the unfamiliar environment, but continues to be confused.                Pertinent Vitals/Pain Pain Assessment: No/denies pain     Hand Dominance     Extremity/Trunk Assessment Upper Extremity Assessment Upper Extremity Assessment: Generalized weakness   Lower Extremity Assessment Lower Extremity Assessment: Generalized weakness   Cervical / Trunk Assessment Cervical / Trunk Assessment: Kyphotic   Communication Communication Communication: No difficulties   Cognition Arousal/Alertness: Awake/alert Behavior During Therapy: Restless Overall Cognitive Status: Impaired/Different from baseline Area of Impairment: Orientation;Following commands;Safety/judgement;Problem solving;Attention Orientation Level: Disoriented to;Place;Situation Current Attention Level: Sustained Memory: Decreased short-term memory Following Commands: Follows one step commands inconsistently Safety/Judgement: Decreased awareness of safety;Decreased awareness of deficits   Problem Solving: Slow processing;Difficulty sequencing;Requires verbal cues;Requires tactile cues                Home Horton Family/patient expects to be discharged to:: Skilled nursing facility                                        Prior Functioning/Environment          Comments: pt reports she lives with her son and he works at [employer] of accuracy of this     OT Diagnosis: Generalized weakness;Cognitive deficits   OT Problem List: Decreased strength;Decreased  activity tolerance;Impaired balance (sitting and/or standing);Decreased safety awareness;Decreased cognition   OT Treatment/Interventions: Self-care/ADL training;Therapeutic exercise;Energy conservation;DME and/or AE  instruction;Therapeutic activities;Cognitive remediation/compensation;Patient/family education;Balance training    OT Goals(Current goals can be found in the care plan section) Acute Rehab OT Goals Patient Stated Goal: pt unable to state OT Goal Formulation: Patient unable to participate in goal setting Time For Goal Achievement: 10/07/14 Potential to Achieve Goals: Good ADL Goals Pt Will Perform Upper Body Bathing: with set-up;sitting Pt Will Perform Upper Body Dressing: with set-up;sitting Pt Will Transfer to Toilet: with supervision;stand pivot transfer;bedside commode  OT Frequency: Min 1X/week    End of Session Equipment Utilized During Treatment: Gait belt  Activity Tolerance: Patient tolerated treatment well Patient left: in bed;with call bell/phone within reach;with nursing/sitter in room   Time: [id number] OT Time Calculation (min): 24 min Charges:  OT General Charges $OT Visit: 1 Procedure OT Evaluation $Initial OT Evaluation Tier I: 1 Procedure OT Treatments $Self Care/Home Management : 8-22 mins G-Codes: OT G-codes **NOT FOR INPATIENT CLASS** Functional Assessment Tool Used: clinical judgment Functional Limitation: Self care Self Care Current Status (E9528(G8987): At least 20 percent but less than 40 percent impaired, limited or restricted Self Care Goal Status (U1324(G8988): At least 1 percent but less than 20 percent impaired, limited or restricted  Kathy Horton, Kathy Horton 09/23/2014, 6:01 PM   Kathy LivingLeeAnn Marie Joselle Horton, OTR/L Occupational Therapist [phone] (pager)

## 2014-09-23 NOTE — Progress Notes (Signed)
UR completed 

## 2014-09-23 NOTE — Progress Notes (Signed)
OT Cancellation Note  Patient Details Name: Kathy Horton MRN: [id number] DOB: July 30, 1922   Cancelled Treatment:    Reason Eval/Treat Not Completed: Patient not medically ready. Pt on strict bedrest until 7:11pm. OT will hold until pt medically ready. MD please update activity orders when appropriate for therapy.   Nena JordanMiller, Nixon Kolton M   Carney LivingLeeAnn Marie Verlon Pischke, OTR/L Occupational Therapist [phone])  09/23/2014, 8:27 AM

## 2014-09-24 ENCOUNTER — Non-Acute Institutional Stay (SKILLED_NURSING_FACILITY): Payer: Medicare Other | Admitting: Adult Health

## 2014-09-24 DIAGNOSIS — K5900 Constipation, unspecified: Secondary | ICD-10-CM | POA: Diagnosis not present

## 2014-09-24 DIAGNOSIS — E034 Atrophy of thyroid (acquired): Secondary | ICD-10-CM

## 2014-09-24 DIAGNOSIS — G8929 Other chronic pain: Secondary | ICD-10-CM

## 2014-09-24 DIAGNOSIS — K219 Gastro-esophageal reflux disease without esophagitis: Secondary | ICD-10-CM

## 2014-09-24 DIAGNOSIS — E038 Other specified hypothyroidism: Secondary | ICD-10-CM | POA: Diagnosis not present

## 2014-09-24 DIAGNOSIS — I10 Essential (primary) hypertension: Secondary | ICD-10-CM | POA: Diagnosis not present

## 2014-09-24 LAB — HIV ANTIBODY (ROUTINE TESTING W REFLEX): HIV SCREEN 4TH GENERATION: NONREACTIVE

## 2014-09-24 LAB — HEMOGLOBIN A1C
HEMOGLOBIN A1C: 5.6 % (ref 4.8–5.6)
Mean Plasma Glucose: 114 mg/dL

## 2014-09-24 LAB — URINE CULTURE

## 2014-09-24 LAB — VITAMIN D 25 HYDROXY (VIT D DEFICIENCY, FRACTURES): VIT D 25 HYDROXY: 25 ng/mL — AB (ref 30.0–100.0)

## 2014-09-28 ENCOUNTER — Non-Acute Institutional Stay (SKILLED_NURSING_FACILITY): Payer: Medicare Other | Admitting: Internal Medicine

## 2014-09-28 DIAGNOSIS — I10 Essential (primary) hypertension: Secondary | ICD-10-CM | POA: Diagnosis not present

## 2014-09-28 DIAGNOSIS — E034 Atrophy of thyroid (acquired): Secondary | ICD-10-CM | POA: Diagnosis not present

## 2014-09-28 DIAGNOSIS — R296 Repeated falls: Secondary | ICD-10-CM

## 2014-09-28 DIAGNOSIS — Z9181 History of falling: Secondary | ICD-10-CM

## 2014-09-28 DIAGNOSIS — E43 Unspecified severe protein-calorie malnutrition: Secondary | ICD-10-CM

## 2014-09-28 DIAGNOSIS — E038 Other specified hypothyroidism: Secondary | ICD-10-CM | POA: Diagnosis not present

## 2014-09-28 DIAGNOSIS — R221 Localized swelling, mass and lump, neck: Secondary | ICD-10-CM | POA: Diagnosis not present

## 2014-10-01 ENCOUNTER — Non-Acute Institutional Stay (SKILLED_NURSING_FACILITY): Payer: Medicare Other | Admitting: Adult Health

## 2014-10-01 DIAGNOSIS — B029 Zoster without complications: Secondary | ICD-10-CM

## 2014-10-03 ENCOUNTER — Other Ambulatory Visit: Payer: Self-pay | Admitting: Internal Medicine

## 2014-10-05 ENCOUNTER — Non-Acute Institutional Stay (SKILLED_NURSING_FACILITY): Payer: Medicare Other | Admitting: Adult Health

## 2014-10-05 DIAGNOSIS — I69391 Dysphagia following cerebral infarction: Secondary | ICD-10-CM

## 2014-10-05 DIAGNOSIS — I10 Essential (primary) hypertension: Secondary | ICD-10-CM | POA: Diagnosis not present

## 2014-10-05 DIAGNOSIS — E43 Unspecified severe protein-calorie malnutrition: Secondary | ICD-10-CM

## 2014-10-05 DIAGNOSIS — E038 Other specified hypothyroidism: Secondary | ICD-10-CM | POA: Diagnosis not present

## 2014-10-05 DIAGNOSIS — I69991 Dysphagia following unspecified cerebrovascular disease: Secondary | ICD-10-CM

## 2014-10-05 DIAGNOSIS — E034 Atrophy of thyroid (acquired): Secondary | ICD-10-CM

## 2014-10-06 ENCOUNTER — Non-Acute Institutional Stay: Payer: 59 | Admitting: Family Medicine

## 2014-10-06 ENCOUNTER — Ambulatory Visit: Payer: 59 | Admitting: Internal Medicine

## 2014-10-06 DIAGNOSIS — D72820 Lymphocytosis (symptomatic): Secondary | ICD-10-CM | POA: Diagnosis not present

## 2014-10-06 DIAGNOSIS — R488 Other symbolic dysfunctions: Secondary | ICD-10-CM | POA: Diagnosis not present

## 2014-10-06 DIAGNOSIS — R269 Unspecified abnormalities of gait and mobility: Secondary | ICD-10-CM | POA: Diagnosis not present

## 2014-10-06 DIAGNOSIS — R1310 Dysphagia, unspecified: Secondary | ICD-10-CM

## 2014-10-06 DIAGNOSIS — E43 Unspecified severe protein-calorie malnutrition: Secondary | ICD-10-CM | POA: Diagnosis not present

## 2014-10-06 DIAGNOSIS — F0391 Unspecified dementia with behavioral disturbance: Secondary | ICD-10-CM | POA: Diagnosis not present

## 2014-10-06 DIAGNOSIS — R590 Localized enlarged lymph nodes: Secondary | ICD-10-CM | POA: Diagnosis not present

## 2014-10-06 DIAGNOSIS — R221 Localized swelling, mass and lump, neck: Secondary | ICD-10-CM | POA: Diagnosis not present

## 2014-10-06 DIAGNOSIS — Z9181 History of falling: Secondary | ICD-10-CM | POA: Diagnosis not present

## 2014-10-06 DIAGNOSIS — E46 Unspecified protein-calorie malnutrition: Secondary | ICD-10-CM | POA: Diagnosis not present

## 2014-10-06 DIAGNOSIS — R1312 Dysphagia, oropharyngeal phase: Secondary | ICD-10-CM | POA: Diagnosis not present

## 2014-10-06 DIAGNOSIS — E039 Hypothyroidism, unspecified: Secondary | ICD-10-CM | POA: Diagnosis not present

## 2014-10-06 DIAGNOSIS — K219 Gastro-esophageal reflux disease without esophagitis: Secondary | ICD-10-CM | POA: Diagnosis not present

## 2014-10-06 DIAGNOSIS — R296 Repeated falls: Secondary | ICD-10-CM | POA: Diagnosis not present

## 2014-10-06 DIAGNOSIS — G8929 Other chronic pain: Secondary | ICD-10-CM | POA: Diagnosis not present

## 2014-10-06 DIAGNOSIS — G9340 Encephalopathy, unspecified: Secondary | ICD-10-CM | POA: Diagnosis not present

## 2014-10-06 DIAGNOSIS — N3946 Mixed incontinence: Secondary | ICD-10-CM | POA: Diagnosis not present

## 2014-10-06 DIAGNOSIS — B029 Zoster without complications: Secondary | ICD-10-CM

## 2014-10-06 DIAGNOSIS — M6281 Muscle weakness (generalized): Secondary | ICD-10-CM | POA: Diagnosis not present

## 2014-10-06 DIAGNOSIS — I6930 Unspecified sequelae of cerebral infarction: Secondary | ICD-10-CM | POA: Diagnosis not present

## 2014-10-06 DIAGNOSIS — M810 Age-related osteoporosis without current pathological fracture: Secondary | ICD-10-CM | POA: Diagnosis not present

## 2014-10-06 DIAGNOSIS — R419 Unspecified symptoms and signs involving cognitive functions and awareness: Secondary | ICD-10-CM | POA: Diagnosis not present

## 2014-10-06 DIAGNOSIS — I10 Essential (primary) hypertension: Secondary | ICD-10-CM

## 2014-10-06 NOTE — Progress Notes (Signed)
Patient ID: [name], female   DOB: 08/09/1922, 81 y.o.   MRN: [id number] Nursing Home Admission History and Physical  Patient name: Kathy Horton Medical record number: [id number] Date of birth: [birth year] Age: 81 y.o. Gender: female  Primary Care Provider: MCDIARMID,TODD D, MD  Assessment and Plan: Kathy Horton is a 81 y.o. female presenting as [facility] patient [facility]. [facility]  Severe protein calorie malnutrition: patient with decreasing weight likely related to poor PO intake relating to dysphagia. Has previously been worked up with barium swallow showing structural issue. Also with history of CVA with dysphagia after this could be contributing as well. Patient will continue to work with speech therapy and have pureed nectar thin liquid diet. Will need nutrition eval and nutritional supplement as well to help provide adequate nutrition. Will need to discuss with son and patient if further work-up is desired at this time with potential for repeat barium swallow or GI evaluation.  Hypothyroidism: last TSH 7.179. Is on synthroid 75 micrograms. Will continue this medication. Will plan for repeat TSH in 2-3 months.   Thyroid lesion: noted on CT scan previously. Will need to confirm with son and patient if further work up or monitoring is desired.   HTN: well controlled at this time. Continue amlodipine.   GERD: no symptoms at this time. Continue omeprazole. Consider discontinuing this in Horton future if asymptomatic.   Shingles: appears to be improving. Will need to confirm with patients pharmacy when valtrex was dispensed to determine end date. Will need 7 days of therapy. Placed on contact precautions.   Constipation: patient reports history of constipation. Reports looser stools than usual recently. Abdominal exam is benign at this time. Will continue to monitor.  Neck cyst: followed by ENT previously and had this cyst drained. Was  felt to be non-malignant at time of last evaluation. Will need to discuss with son and patient their desire to go back to see ENT for potential further drainage.   Glaucoma: patient with poor vision and apparently not on any eye drops for this per previous PCP note. Will need to consider having patient follow-up with optho. Will discuss with patients son as to whether Horton patient has been to optho recently.   Will continue ativan prn and celebrex prn at this time, though will need to discuss discontinuing these medications given Horton possible adverse affects in an elderly patient.   Attempted to call patients son to obtain additional history though there was no answer and I left a message asking that he call our office to discuss his mother's care and medical issues.  Kathy Horton is a 81 y.o. female is a new patient to our [facility] presenting as [facility] care [facility]. Per patient transfer of care "was because my son wanted me to come here." She was admitted there following a hospitalization for AMS. During this hospital stay from 2/10-2/11 she was found to have no identifiable cause of her AMS and felt to be appropriate for SNF so was discharged to [facility]. Of note she has had persistent weight loss over Horton past year from 91 lbs now to 70 lbs. This was felt to be related to her dysphagia, which had previously been worked up by barium swallow and found to have structural component. She has been evaluated by SLP for this and has been on nectar thin pureed diet for this. She has been  able to swallow liquids and take her pills crushed. There was some discussion of repeating a barium swallow this past December, though it appears that this was not pursued following her recent hospitalization. At [facility] she received services from ST/PT/OT.  Patients other medical issues are as follows: HTN: on amlodipine at this time. Well controlled  today. Denies CP and dyspnea.  Hypothyroidism: most recent TSH elevated and synthroid increased to 75 micrograms during last hospitalization. Glaucoma: poor vision per last PCP note. Has been followed by Optho and apparently has had eye drops stopped recently. GERD: no complaints of abdominal pain today and no burning or sour taste. Has been on omeprazole 20 mg daily. Shingles: apparently new diagnosis from prior nursing home, though no records of this. Was on contact precautions with this. Per nursing here rash appears small and patient has complained of intermittent pain in Horton dermatomal site of Horton rash. Is on valtrex.  Thyroid cyst: initially evaluated with CT soft tissue neck and then evaluated and drained by ENT. Found to be a cyst. No malignant cells per review of PCP note. Has had issues with this recurring recently and there apparently was talk of going back to see ENT to have this drained again.  Thyroid lesion: 3 mm lesion noted on CT soft tissue neck. Per review of PCP last note Horton family was in favor of monitoring this issue and there may have been a plan to obtain an [place] of Horton thyroid to evaluate this further, though there is no evidence in Horton chart that this was pursued further.   Review Of Systems: Per HPI with Horton following additions: none Otherwise 12 point review of systems was performed and was unremarkable.  Patient Active Problem List   Diagnosis Date Noted  . Essential hypertension, benign 09/28/2014  . Elevated fasting glucose 09/22/2014  . Severe protein-calorie malnutrition 07/27/2014  . Goals of care, counseling/discussion 07/27/2014  . Anemia 07/27/2014  . Sacral wound 07/27/2014  . At high risk for falls 09/10/2013  . Cerumen impaction 06/16/2013  . Abnormality of gait 11/18/2012  . Hypercalcemia 05/16/2012  . Leg pain, bilateral 04/15/2012  . PVD (peripheral vascular disease) 04/15/2012  . Constipation 12/04/2011  . Mass of left side of neck 12/04/2011  .  Glaucoma 11/02/2011  . Hypothyroidism 11/02/2011  . Osteopenia 11/02/2011  . CVA, old, dysphagia 11/02/2011  . Dysphagia 11/02/2011  . GERD (gastroesophageal reflux disease) 11/02/2011   Past Medical History: Past Medical History  Diagnosis Date  . Glaucoma     recently started on acetazolamide   . HTN (hypertension)     not on any home medications  . Anemia   . Thyroid disease   . Stroke   . Dysphagia   . Mass of left side of neck   . Malnutrition    Past Surgical History: Past Surgical History  Procedure Laterality Date  . Right wrist    . Eye surgery    . Appendectomy    . Tonsillectomy     Social History: History  Substance Use Topics  . Smoking status: Former Smoker -- 0.00 packs/day for 50 years    Types: Cigarettes    Quit date: 04/15/1989  . Smokeless tobacco: Never Used  . Alcohol Use: No   Additional social history: none  Please also refer to relevant sections of EMR.  Family History: Family History  Problem Relation Age of Onset  . Diabetes Mother   . Heart failure Mother   . Hypertension  Mother   . Cancer Father    Allergies and Medications: Allergies  Allergen Reactions  . Penicillins    Current Outpatient Prescriptions on File Prior to Visit  Medication Sig Dispense Refill  . acetaminophen (TYLENOL) 325 MG tablet Take 325 mg by mouth every 6 (six) hours as needed for moderate pain.    Marland Kitchen. amLODipine (NORVASC) 10 MG tablet TAKE 1 TABLET EVERY DAY 30 tablet 3  . aspirin 81 MG tablet Take 81 mg by mouth 2 (two) times daily.    . celecoxib (CELEBREX) 100 MG capsule Take 1 capsule (100 mg total) by mouth 2 (two) times daily as needed. 60 capsule 5  . diclofenac sodium (VOLTAREN) 1 % GEL Apply 2 g topically 4 (four) times daily as needed. (Patient not taking: Reported on 08/12/2014) 1 Tube 1  . docusate sodium (COLACE) 100 MG capsule Take 1 capsule (100 mg total) by mouth 2 (two) times daily as needed. 60 capsule 5  . feeding supplement (ENSURE  CLINICAL STRENGTH) LIQD Take 237 mLs by mouth 3 (three) times daily between meals. (Patient taking differently: Take 237 mLs by mouth 2 (two) times daily. ) 100 Bottle 3  . levothyroxine (SYNTHROID, LEVOTHROID) 50 MCG tablet Take 1.5 tablets (75 mcg total) by mouth daily before breakfast. 30 tablet 3  . omeprazole (PRILOSEC) 40 MG capsule Take 1 capsule (40 mg total) by mouth daily. 90 capsule 3  . polyethylene glycol (MIRALAX / GLYCOLAX) packet Take 17 g by mouth daily. (Patient taking differently: Take 17 g by mouth daily as needed for moderate constipation. ) 30 each 6  . [DISCONTINUED] acetaZOLAMIDE (DIAMOX) 500 MG capsule Take 500 mg by mouth daily after breakfast.     No current facility-administered medications on file prior to visit.    Objective: BP 130/62 mmHg  Pulse 84  Temp(Src) 96.7 F (35.9 C)  Resp 18 Exam: General: NAD, laying in bed comfortably, cachectic HEENT: MMM, dentures in place, stares in to distance, unable to track, left neck with palpable cystic structure that has no overlying erythema or induration Cardiovascular: rrr, no murmur appreciated Respiratory: CTAB, nml WOB Abdomen: s, NT, ND Extremities: thin, no edema, no lesions noted Skin: two small crusted lesions on right low back, no sacral wounds noted, no heel wounds noted Neuro: alert, conversational, moves extremities equally   Glori LuisEric G Sonnenberg, MD 10/06/2014, 3:50 PM PGY-3, [facility]

## 2014-10-07 ENCOUNTER — Telehealth: Payer: Self-pay | Admitting: Family Medicine

## 2014-10-07 NOTE — Telephone Encounter (Signed)
Called patients son to discuss patients admission to [facility]. He notes that prior to recent hospitalization she was living at home with him and doing well. The day of admission to the hospital the patient had fallen and he thought she had a stroke, so took her to the ED and she was admitted. [facility] was then discharged [facility]. The son did not like how she was treated while there, so decided to move her to [facility]. He notes that the goal of this stay is hopefully for short term rehab and then to go home, though the patient and the son are realistic that this may result in a long term stay in the NH. He reports that prior to the hospitalization they were in the process of having her see optho, GI, and ENT. She missed her optho and ENT appointments recently and he would like her to have these appointments rescheduled. Of note he states that her blindness was slowly progressive until 2-3 months ago when she developed more rapid progression of this. She has not been evaluated by optho for this. Will place orders for these appointments to be scheduled.

## 2014-10-07 NOTE — Progress Notes (Signed)
FMTS Attending Note  I personally saw and evaluated the patient. The plan of care was discussed with the resident team. I agree with the assessment and plan as documented by the resident.   Subjective: 81 year old female [facility] for acute encephalopathy. Workup was negative for specific etiology however symptoms resolved with supportive care. [facility] and will be under the care of the family practice teaching service. Patient currently reports intermittent left-sided flank pain, no associated dysuria, no associated fevers or chills, has been started on Valtrex for suspected shingles, is unclear how long she has been on Valtrex. Patient also has a history of dysphagia status post stroke. This has led to severe protein calorie malnutrition and weight loss. Patient also has past medical history of hypertension, hypothyroidism, PVD, CVA, left-sided neck mass, and dysphagia. Please refer to resident note for additional HPI.   Objective: Vitals: reviewed Gen: pleasant female, NAD, however occasionally has left flank/side pain which causes her to jerk her head and body to the right HEENT: Normocephalic, right pupil nonreactive, extraocular movements intact, no conjunctival pallor or erythema, nasal septum midline, no rhinorrhea, dry mucous membranes, neck supple, left neck mass consistent with cystic structure, no anterior posterior cervical lymphadenopathy  Cardiac: Regular rate and rhythm, S1 and S2 present, no murmurs, no heaves or thrills Respiratory: Clear to patient bilaterally, normal effort Abdomen: Soft, bowel sounds present, aortic bruit present, mild tenderness of the left upper and lower abdomen as well as the left side/flank MSK: Tenderness of the left side and flank as well as the left lumbar paraspinal muscles Skin: No rash present Extremities: No edema, extremities  are thin, 2+ radial pulses bilaterally, 2 posterior cells pedis pulses bilaterally, extremities are warm and well perfused Neuro: Cranial nerves II through XII intact, moving all extremities, sensation grossly intact, grip strength 5 out of 5 bilaterally, leg strength grossly 5 out of 5 bilaterally  Reviewed recent hospital records including lab work and imaging.  Assessment and plan: 81 year old female [facility] under the care of family medicine teaching service. 1. Dysphasia-secondary to previous stroke, agree with nectar thickened liquids as well as continued speech therapy, consider GI referral if symptoms continue to progress 2. Protein calorie nutrition-secondary to dysphasia, continue nutritional supplementation 3. Left-sided flank/side pain-most consistent with shingles versus MSK etiology, recent UA negative for hematuria which suggests against nephrolithiasis, patient recently completed course of antibiotics for UTI and is currently asymptomatic, if symptoms persist could consider imaging to rule out nephrolithiasis, continue pain control with Tylenol, determined and date of Valtrex as patient will need seven-day course, if symptoms remain uncontrolled consider Ultram or low-dose gabapentin 4. Hypothyroidism-continue current Synthroid dose and monitor TSH 5. Blindness secondary to glaucoma-continue routine ophthalmology follow-up 6. Hypertension-well controlled on amlodipine Patient's other medical conditions appear stable.   Patient reports that her son is her health care power of attorney. The resident physician will attempt to contact the son to discuss goals of care. From the recent hospital discharge summary appears that the patient will require long-term care.   Donnella ShamKyle Cambreigh Dearing M.D.

## 2014-10-11 DIAGNOSIS — G8929 Other chronic pain: Secondary | ICD-10-CM | POA: Insufficient documentation

## 2014-10-11 NOTE — Progress Notes (Signed)
This encounter was created in error - please disregard.

## 2014-10-11 NOTE — Progress Notes (Signed)
Patient ID: Kathy JordanMargaret F Horton, female   DOB: 04/30/1922, 81 y.o.   MRN: [id number]  [facility]     Allergies  Allergen Reactions  . Penicillins        Chief Complaint  Patient presents with  . Hospitalization Follow-up    HPI:  She has been hospitalized for increased weakness at home with family; acute metabolic encephalopathy. She is here for short term rehab. She was treated for a 80,000 unit e-coli infection. She is presently not voicing any complaints of pain. There are no nursing concerns at this time.   Past Medical History  Diagnosis Date  . Glaucoma     recently started on acetazolamide   . HTN (hypertension)     not on any home medications  . Anemia   . Thyroid disease   . Stroke   . Dysphagia   . Mass of left side of neck   . Malnutrition     Past Surgical History  Procedure Laterality Date  . Right wrist    . Eye surgery    . Appendectomy    . Tonsillectomy      VITAL SIGNS BP 125/60 mmHg  Pulse 75  Ht 5\' 2"  (1.575 m)  Wt 72 lb (32.659 kg)  BMI 13.17 kg/m2   Outpatient Encounter Prescriptions as of 09/24/2014  Medication Sig  . acetaminophen (TYLENOL) 325 MG tablet Take 325 mg by mouth every 6 (six) hours as needed for moderate pain.  Marland Kitchen. amLODipine (NORVASC) 10 MG tablet TAKE 1 TABLET EVERY DAY  . aspirin 81 MG tablet Take 81 mg by mouth 2 (two) times daily.  . celecoxib (CELEBREX) 100 MG capsule Take 1 capsule (100 mg total) by mouth 2 (two) times daily as needed.  . diclofenac sodium (VOLTAREN) 1 % GEL Apply 2 g topically 4 (four) times daily as needed. (Patient not taking: Reported on 08/12/2014)  . docusate sodium (COLACE) 100 MG capsule Take 1 capsule (100 mg total) by mouth 2 (two) times daily as needed.  . feeding supplement (ENSURE CLINICAL STRENGTH) LIQD Take 237 mLs by mouth 3 (three) times daily between meals. (Patient taking differently: Take 237 mLs by mouth 2 (two) times daily. )  . levothyroxine (SYNTHROID,  LEVOTHROID) 50 MCG tablet Take 1.5 tablets (75 mcg total) by mouth daily before breakfast.  . omeprazole (PRILOSEC) 40 MG capsule Take 1 capsule (40 mg total) by mouth daily.   Hydralazine 25 mg  Take 25 mg twice daily   . polyethylene glycol (MIRALAX / GLYCOLAX) packet Take 17 g by mouth daily. (Patient taking differently: Take 17 g by mouth daily as needed for moderate constipation. )     SIGNIFICANT DIAGNOSTIC EXAMS  09-22-14: chest x-ray; 1.  No active disease.2. COPD.  09-22-14: bilateral hip and pelvic x-ray: No acute fracture or malalignment identified. Of note, the bones are profoundly osteopenic which can limit evaluation for subtle, nondisplaced fracture. Atherosclerotic vascular calcifications.  09-22-14: ct of head and cervical spine: 1. No evidence of intracranial or cervical spine injury. 2. Brain atrophy and chronic small vessel disease. 3. 5 cm cystic mass in the left neck, benign given relative stability since 2014.     LABS REVIEWED:   09-22-14: wbc 8.0; hgb 12.1; hct 37.1; mcv 97.2; plt 306; glucose 101; bun 11; creat 0.67; k+4.9; na++144; liver normal albumin 4.4; hgb a1c 5.9; vit b12: 406; folate 17.2; tsh 7.179 09-23-14: HIV: NR: vit d: 25.0; free t4: 0.88     Review  of Systems  Constitutional: Positive for malaise/fatigue.  Respiratory: Negative for cough and shortness of breath.   Cardiovascular: Negative for chest pain, palpitations and leg swelling.  Gastrointestinal: Negative for heartburn, abdominal pain and constipation.  Musculoskeletal: Negative for myalgias and joint pain.  Skin: Negative.   Psychiatric/Behavioral: Negative for depression. The patient is not nervous/anxious.      Physical Exam  Constitutional: No distress.  Frail   Neck: Neck supple. No JVD present. No thyromegaly present.  Cardiovascular: Normal rate, regular rhythm and intact distal pulses.   Respiratory: Effort normal and breath sounds normal. No respiratory distress.  GI:  Soft. Bowel sounds are normal. She exhibits no distension. There is no tenderness.  Musculoskeletal: She exhibits no edema.  Is able to move all extremities   Neurological: She is alert.  Skin: Skin is warm and dry. She is not diaphoretic.       ASSESSMENT/ PLAN:  1. Hypertension: will continue norvasc 10 mg daily; hydralazine 25 mg twice daily; will monitor   2. Hypothyroidism: will continue synthroid 75 mcg daily; will check tsh in one month  3. CVA: is neurologically stable will continue asa 81 mg daily   4.  Chronic pain: her pain is presently being managed; will continue celebrex 100 mg daily as needed; and voltaren gel 2 gm every 6 hours as needed to back will monitor   5. Constipation: will continue colace daily as needed; and miralax daily   6. Gerd: will continue prilosec 40 mg daily    Synthia Innocent NP [facility]  Contact [phone] Monday through Friday 8am- 5pm  After hours call [phone]

## 2014-10-13 ENCOUNTER — Other Ambulatory Visit: Payer: Self-pay | Admitting: Family Medicine

## 2014-10-13 DIAGNOSIS — K219 Gastro-esophageal reflux disease without esophagitis: Secondary | ICD-10-CM

## 2014-10-13 MED ORDER — OMEPRAZOLE 20 MG PO CPDR: 20.0000 mg | DELAYED_RELEASE_CAPSULE | Freq: Every day | ORAL | Status: AC

## 2014-10-13 MED ORDER — DICLOFENAC SODIUM 1 % TD GEL: 2.0000 g | Freq: Four times a day (QID) | TRANSDERMAL | Status: DC | PRN

## 2014-10-19 ENCOUNTER — Encounter: Payer: Self-pay | Admitting: Adult Health

## 2014-10-19 NOTE — Progress Notes (Signed)
Patient ID: Kathy Horton, female   DOB: February 16, 1922, 81 y.o.   MRN: [id number]  [name] [place]     Allergies  Allergen Reactions  . Penicillins        Chief Complaint  Patient presents with  . Acute Visit    rash     HPI:  She has a painful itching rash present on her left lower back. And she states that overall she is not feeling good. She states that since she has had the rash for the past couple of days she is feeling worse.  The nursing staff is concerned about shingles.    Past Medical History  Diagnosis Date  . Glaucoma     recently started on acetazolamide   . HTN (hypertension)     not on any home medications  . Anemia   . Thyroid disease   . Stroke   . Dysphagia   . Mass of left side of neck   . Malnutrition     Past Surgical History  Procedure Laterality Date  . Right wrist    . Eye surgery    . Appendectomy    . Tonsillectomy      VITAL SIGNS BP 122/66 mmHg  Pulse 92  Ht 5\' 2"  (1.575 m)  Wt 70 lb (31.752 kg)  BMI 12.80 kg/m2   Outpatient Encounter Prescriptions as of 10/01/2014  Medication Sig  . acetaminophen (TYLENOL) 325 MG tablet Take 325 mg by mouth every 6 (six) hours as needed for moderate pain.  Marland Kitchen. amLODipine (NORVASC) 10 MG tablet TAKE 1 TABLET EVERY DAY  . aspirin 81 MG tablet Take 81 mg by mouth 2 (two) times daily.  Marland Kitchen. docusate sodium (COLACE) 100 MG capsule Take 1 capsule (100 mg total) by mouth 2 (two) times daily as needed.  . feeding supplement (ENSURE CLINICAL STRENGTH) LIQD Take 237 mLs by mouth 3 (three) times daily between meals. (Patient taking differently: Take 237 mLs by mouth 2 (two) times daily. )  . levothyroxine (SYNTHROID, LEVOTHROID) 50 MCG tablet Take 1.5 tablets (75 mcg total) by mouth daily before breakfast.  . polyethylene glycol (MIRALAX / GLYCOLAX) packet Take 17 g by mouth daily. (Patient taking differently: Take 17 g by mouth daily as needed for moderate constipation. )     SIGNIFICANT  DIAGNOSTIC EXAMS   09-22-14: chest x-ray; 1.  No active disease.2. COPD.  09-22-14: bilateral hip and pelvic x-ray: No acute fracture or malalignment identified. Of note, the bones are profoundly osteopenic which can limit evaluation for subtle, nondisplaced fracture. Atherosclerotic vascular calcifications.  09-22-14: ct of head and cervical spine: 1. No evidence of intracranial or cervical spine injury. 2. Brain atrophy and chronic small vessel disease. 3. 5 cm cystic mass in the left neck, benign given relative stability since 2014.     LABS REVIEWED:   09-22-14: wbc 8.0; hgb 12.1; hct 37.1; mcv 97.2; plt 306; glucose 101; bun 11; creat 0.67; k+4.9; na++144; liver normal albumin 4.4; hgb a1c 5.9; vit b12: 406; folate 17.2; tsh 7.179 09-23-14: HIV: NR: vit d: 25.0; free t4: 0.88      ROS Constitutional: negative for fatigue.  Respiratory: Negative for cough and shortness of breath.   Cardiovascular: Negative for chest pain, palpitations and leg swelling.  Gastrointestinal: Negative for heartburn, abdominal pain and constipation.  Musculoskeletal: Negative for myalgias and joint pain.  Skin: has painful itchy rash on left lower back  Psychiatric/Behavioral: Negative for depression. The patient is not nervous/anxious.  Physical Exam Constitutional: No distress.  Frail   Neck: Neck supple. No JVD present. No thyromegaly present.  Cardiovascular: Normal rate, regular rhythm and intact distal pulses.   Respiratory: Effort normal and breath sounds normal. No respiratory distress.  GI: Soft. Bowel sounds are normal. She exhibits no distension. There is no tenderness.  Musculoskeletal: She exhibits no edema.  Is able to move all extremities   Neurological: She is alert.  Skin: Skin is warm and dry. She is not diaphoretic. Has on left lower back along a dermatone a pustular rash present representative of shingles     ASSESSMENT/ PLAN:  1. Shingles: will being valtrex 1 gm three  times daily for one week and will monitor her status.    Synthia Innocent NP [facility]  Contact [phone] Monday through Friday 8am- 5pm  After hours call [phone]

## 2014-10-20 ENCOUNTER — Other Ambulatory Visit: Payer: Self-pay | Admitting: Family Medicine

## 2014-10-20 ENCOUNTER — Non-Acute Institutional Stay: Payer: 59 | Admitting: Family Medicine

## 2014-10-20 DIAGNOSIS — F0391 Unspecified dementia with behavioral disturbance: Secondary | ICD-10-CM

## 2014-10-20 DIAGNOSIS — F03918 Unspecified dementia, unspecified severity, with other behavioral disturbance: Secondary | ICD-10-CM | POA: Insufficient documentation

## 2014-10-20 MED ORDER — HALOPERIDOL 0.5 MG PO TABS: 0.2500 mg | ORAL_TABLET | Freq: Every day | ORAL | Status: DC

## 2014-10-20 MED ORDER — LIDOCAINE 5 % EX PTCH: 1.0000 | MEDICATED_PATCH | CUTANEOUS | Status: DC

## 2014-10-20 MED ORDER — HALOPERIDOL 0.5 MG PO TABS: 0.2500 mg | ORAL_TABLET | Freq: Four times a day (QID) | ORAL | Status: DC | PRN

## 2014-10-20 MED ORDER — MIRTAZAPINE 7.5 MG PO TABS: 7.5000 mg | ORAL_TABLET | Freq: Every day | ORAL | Status: DC

## 2014-10-20 NOTE — Progress Notes (Signed)
   Subjective:    Patient ID: Kathy Horton, female    DOB: 1922-06-22, 81 y.o.   MRN: [id number] History obtained from Patient and Nursing Staff HPI  Agitated Behavior, recurrent - Onset last two days in evening - Moving wheelchair out to common areas, running into other residents, striking out at staff, refusing care, cursing and yelling - No complaint of pain, constipation, shortness of breath - No dysuria, no fever. - Lorazepam 0.25 mg qhs was stopped on admission to NH - No reported alcohol abuse.  - [facility] hospitalization for Altered Mental Status for which no cause could be found. - History of CVA      Review of Systems  See HPI     Objective:   Physical Exam   VS stable General: Alert, sitting on side of bed, cooperative, NAD, pleasant,  Not oriented to date, day, month, year, thought she was at home 3-word recall (3 / 3 immediate, 0 / 3 at 3 minute recall) Lungs: BCTA, no acc muscle use, talking in full sentences Cor: 2/6 SEM, reg irregular,  Abdomen: soft, NT, ND, (+) BS Ext: bil trace ankle edema, appropriate warmth to touch.  Neuro: moving all extremities, able to follow one-step commends          Assessment & Plan:

## 2014-10-20 NOTE — Assessment & Plan Note (Addendum)
New issue  Appears to be occuring just in evening. [facility].   No evidence of autonomic instability of benzo withdrawal. Pt had relatively normal CMET and CBC and Vit B12 / Folate on 09/23/14 hospitalization. Evidence of Subclinical hypothyroidism too.  Head Ct 02/16 showed advanced atrophy but no acute lesions.  Will check for metabolic and infectious process Working diagnosis if Dementia with Behavioral disturbance of Inappropriate behavior type. The aggressive behavior is endangering staff and residents, as well as interfering with the provision of care to Ms Janee Mornhompson.  Recommend low dose oral Haldol 0.25 mg in evening with Haldol 0.25 mg every 6 hours if needed for agitation. Monitor for response and advers effects. I spoke with patient's son, Mr Gita KudoRufus Brooks by phone today.  I explained that I would like to use the Haldol for a couple weeks while his mother is adjusting to the facility.  He was comfortable with the short-term use of Haldol .

## 2014-10-21 ENCOUNTER — Other Ambulatory Visit: Payer: Self-pay | Admitting: Family Medicine

## 2014-10-21 ENCOUNTER — Other Ambulatory Visit: Payer: Self-pay | Admitting: Internal Medicine

## 2014-10-21 LAB — CBC AND DIFFERENTIAL
BASO%: 1 %
BASOS ABS: 0 /uL
EOS%: 3 %
Eosinophils Absolute: 0 /uL
Granulocyte count absolute: 2.9
Granulocyte percent: 70 %G (ref 37–80)
HCT: 32.1 % — AB (ref 36.0–46.0)
HEMOGLOBIN: 10.7 g/dL — AB (ref 12.0–15.0)
LYMPH%: 13 %
LYMPHOCYTES ABSOLUTE (KUC): 0.5 10*3/uL (ref 0.1–1.8)
MCH (KUC): 31.7 pg (ref 26–32)
MCHC: 33.3 g/dL (ref 32–36.5)
MCV (KUC): 95 fL (ref 78–100)
MONOCYTES ABSOLUTE (KUC): 0.5 10*3/uL (ref 0.1–1)
MONOS PCT: 13 % — AB (ref 3–10)
MPV: 9.9 fL (ref 7.8–11)
PLATELET COUNT (KUC): 364 10*3/uL (ref 140–400)
RBC: 3.38 MIL/uL — AB (ref 3.87–5.111)
RDW: 15.7 % — AB (ref 11.6–14)
WBC: 4.2 10*3/uL (ref 4–10.5)

## 2014-10-21 LAB — COMPREHENSIVE METABOLIC PANEL
ALBUMIN: 3.5 g/dL (ref 3.5–5.2)
ALT: 9 U/L (ref 7–35)
AST: 14 U/L (ref 0–37)
Alkaline Phosphatase: 38 U/L — AB (ref 39–117)
BUN: 24 mg/dL — AB (ref 6–23)
Bilirubin,Total: 0.3 mg/dL (ref 0.2–1.2)
CALCIUM: 9.4 mg/dL (ref 8.4–10.5)
CO2: 25 meq/L (ref 19–32)
Chloride: 105 mEq/L (ref 96–112)
Creat: 0.83 mg/dL (ref 0.50–1.10)
Glucose: 78 mg/dL (ref 70–99)
POTASSIUM: 4.5 meq/L (ref 3.5–5.3)
Sodium: 143 mEq/L (ref 135–145)
Total Protein: 6.4 g/dL (ref 6.0–8.3)

## 2014-10-21 LAB — VITAMIN D 25 HYDROXY (VIT D DEFICIENCY, FRACTURES): Vit D, 25-Hydroxy: 35 ng/mL (ref 30–100)

## 2014-10-21 NOTE — Progress Notes (Signed)
Patient ID: Kathy JordanMargaret F Horton, female   DOB: [birth year], 81 y.o.   MRN: [id number]  [facility]    Allergies  Allergen Reactions  . Penicillins        Chief Complaint  Patient presents with  . Discharge Note    HPI:  She is being discharged to skilled nursing per her family request in order to be followed at that facility by her pcp. She will not need dme; home health. She is not voicing any complaints.   Past Medical History  Diagnosis Date  . Glaucoma     recently started on acetazolamide   . HTN (hypertension)     not on any home medications  . Anemia   . Thyroid disease   . Stroke   . Dysphagia   . Mass of left side of neck   . Malnutrition     Past Surgical History  Procedure Laterality Date  . Right wrist    . Eye surgery    . Appendectomy    . Tonsillectomy      VITAL SIGNS BP 142/80 mmHg  Pulse 72  Ht 5' (1.524 m)  Wt 70 lb (31.752 kg)  BMI 13.67 kg/m2   Outpatient Encounter Prescriptions as of 10/05/2014  Medication Sig  . acetaminophen (TYLENOL) 325 MG tablet Take 325 mg by mouth every 6 (six) hours as needed for moderate pain.  Marland Kitchen. amLODipine (NORVASC) 10 MG tablet TAKE 1 TABLET EVERY DAY  . aspirin 81 MG tablet Take 81 mg by mouth 2 (two) times daily.  Marland Kitchen. docusate sodium (COLACE) 100 MG capsule Take 1 capsule (100 mg total) by mouth 2 (two) times daily as needed.  . feeding supplement (ENSURE CLINICAL STRENGTH) LIQD Take 237 mLs by mouth 3 (three) times daily between meals. (Patient taking differently: Take 237 mLs by mouth 2 (two) times daily. )  . levothyroxine (SYNTHROID, LEVOTHROID) 50 MCG tablet Take 1.5 tablets (75 mcg total) by mouth daily before breakfast.  . polyethylene glycol (MIRALAX / GLYCOLAX) packet Take 17 g by mouth daily. (Patient taking differently: Take 17 g by mouth daily as needed for moderate constipation. )     SIGNIFICANT DIAGNOSTIC EXAMS   09-22-14: chest x-ray; 1.  No active disease.2. COPD.  09-22-14:  bilateral hip and pelvic x-ray: No acute fracture or malalignment identified. Of note, the bones are profoundly osteopenic which can limit evaluation for subtle, nondisplaced fracture. Atherosclerotic vascular calcifications.  09-22-14: ct of head and cervical spine: 1. No evidence of intracranial or cervical spine injury. 2. Brain atrophy and chronic small vessel disease. 3. 5 cm cystic mass in the left neck, benign given relative stability since 2014.     LABS REVIEWED:   09-22-14: wbc 8.0; hgb 12.1; hct 37.1; mcv 97.2; plt 306; glucose 101; bun 11; creat 0.67; k+4.9; na++144; liver normal albumin 4.4; hgb a1c 5.9; vit b12: 406; folate 17.2; tsh 7.179 09-23-14: HIV: NR: vit d: 25.0; free t4: 0.88     ROS Constitutional: negative for fatigue.  Respiratory: Negative for cough and shortness of breath.   Cardiovascular: Negative for chest pain, palpitations and leg swelling.  Gastrointestinal: Negative for heartburn, abdominal pain and constipation.  Musculoskeletal: Negative for myalgias and joint pain.  Skin: no complaints   Psychiatric/Behavioral: Negative for depression. The patient is not nervous/anxious.    Physical Exam Constitutional: No distress.  Frail   Neck: Neck supple. No JVD present. No thyromegaly present.  Cardiovascular: Normal rate, regular rhythm and intact distal  pulses.   Respiratory: Effort normal and breath sounds normal. No respiratory distress.  GI: Soft. Bowel sounds are normal. She exhibits no distension. There is no tenderness.  Musculoskeletal: She exhibits no edema.  Is able to move all extremities   Neurological: She is alert.  Skin: Skin is warm and dry. She is not diaphoretic    ASSESSMENT/ PLAN:  Will discharge her to skilled nursing facility; no home health no dme needed. Prescription was written for ativan 0.5 mg #30 tabs.     Synthia Innocent NP [facility]  Contact [phone] Monday through Friday 8am- 5pm  After hours call  [phone]

## 2014-10-22 ENCOUNTER — Other Ambulatory Visit (HOSPITAL_COMMUNITY)
Admission: RE | Admit: 2014-10-22 | Discharge: 2014-10-22 | Disposition: A | Discharge status: Home / Self Care | Payer: 59 | Source: Ambulatory Visit | Attending: Otolaryngology | Admitting: Otolaryngology

## 2014-10-22 ENCOUNTER — Other Ambulatory Visit: Payer: Self-pay | Admitting: Otolaryngology

## 2014-10-22 DIAGNOSIS — R590 Localized enlarged lymph nodes: Secondary | ICD-10-CM | POA: Diagnosis not present

## 2014-10-22 DIAGNOSIS — R221 Localized swelling, mass and lump, neck: Secondary | ICD-10-CM | POA: Insufficient documentation

## 2014-10-22 DIAGNOSIS — D72820 Lymphocytosis (symptomatic): Secondary | ICD-10-CM | POA: Diagnosis not present

## 2014-10-24 ENCOUNTER — Encounter: Payer: Self-pay | Admitting: Internal Medicine

## 2014-10-24 NOTE — Progress Notes (Signed)
Patient ID: Kathy Horton, female   DOB: [birth year], 81 y.o.   MRN: [id number]    HISTORY AND PHYSICAL  Location:  [place] of Service: SNF [phone])   Extended Emergency Contact Information Primary Emergency Contact: Brooks,[name] Address: [place] [address] [place] Phone: [phone] Mobile Phone: [phone] Relation: Son  Advanced Directive information   Full Code  Chief Complaint  Patient presents with  . New Admit To SNF    acute encephalopathy, falls, severe protein calorie malnutrition, HTN, hypothyroidism, GERD, osteoporosis    HPI:  81 yo female seen today as a new admission into SNF after hospitalization for acute encephalopathy. She reports feeling better since d/c. She is c/a increasing left neck cyst that has been I & D in the past. She states cysts makes it difficlut for her to hold up her head. Her thyroid was borderline controlled in the hospital and it was recommended thyat she have repeat level in 6 weeks. No falls since d/x. Her appetite is reduced overall. Sleeping well. No nursing issues  Past Medical History  Diagnosis Date  . Glaucoma     recently started on acetazolamide   . HTN (hypertension)     not on any home medications  . Anemia   . Thyroid disease   . Stroke   . Dysphagia   . Mass of left side of neck   . Malnutrition     Past Surgical History  Procedure Laterality Date  . Right wrist    . Eye surgery    . Appendectomy    . Tonsillectomy      Patient Care Team: Blane Ohara McDiarmid, MD as PCP - General (Family Medicine)  History   Social History  . Marital Status: Divorced    Spouse Name: N/A  . Number of Children: N/A  . Years of Education: N/A   Occupational History  . Not on file.   Social History Main Topics  . Smoking status: Former Smoker -- 0.00 packs/day for 50 years    Types: Cigarettes    Quit date: 04/15/1989  . Smokeless tobacco:  Never Used  . Alcohol Use: No  . Drug Use: No  . Sexual Activity: No   Other Topics Concern  . Not on file   Social History Narrative   Lives alone. Has family members who check on her every day. Is pretty functional at baseline per the son. She cooks her own food and is able to perform her activities of daily living. She does not go out of the house because she could not drive and was using bus before but has stopped using it for a long time. Never smoked cigarettes, never drank alcohol or any illicit drug use. Has [employer] health care Medicare.     reports that she quit smoking about 25 years ago. Her smoking use included Cigarettes. She smoked 0.00 packs per day for 50 years. She has never used smokeless tobacco. She reports that she does not drink alcohol or use illicit drugs.  Family History  Problem Relation Age of Onset  . Diabetes Mother   . Heart failure Mother   . Hypertension Mother   . Cancer Father    No family status information on file.    Immunization History  Administered Date(s) Administered  . Influenza Split 10/16/2011  . Pneumococcal Polysaccharide-23 11/02/2011    Allergies  Allergen  Reactions  . Penicillins     Medications: Patient's Medications  New Prescriptions   HALOPERIDOL (HALDOL) 0.5 MG TABLET    Take 0.5 tablets (0.25 mg total) by mouth daily.   HALOPERIDOL (HALDOL) 0.5 MG TABLET    Take 0.5 tablets (0.25 mg total) by mouth every 6 (six) hours as needed for agitation.   LIDOCAINE (LIDODERM) 5 %    Place 1 patch onto the skin daily. Remove & Discard patch within 12 hours or as directed by MD   MIRTAZAPINE (REMERON) 7.5 MG TABLET    Take 1 tablet (7.5 mg total) by mouth at bedtime.  Previous Medications   ACETAMINOPHEN (TYLENOL) 325 MG TABLET    Take 325 mg by mouth every 6 (six) hours as needed for moderate pain.   AMLODIPINE (NORVASC) 10 MG TABLET    TAKE 1 TABLET EVERY DAY   ASPIRIN 81 MG TABLET    Take 81 mg by mouth 2 (two) times daily.    DOCUSATE SODIUM (COLACE) 100 MG CAPSULE    Take 1 capsule (100 mg total) by mouth 2 (two) times daily as needed.   FEEDING SUPPLEMENT (ENSURE CLINICAL STRENGTH) LIQD    Take 237 mLs by mouth 3 (three) times daily between meals.   LEVOTHYROXINE (SYNTHROID, LEVOTHROID) 50 MCG TABLET    Take 1.5 tablets (75 mcg total) by mouth daily before breakfast.   POLYETHYLENE GLYCOL (MIRALAX / GLYCOLAX) PACKET    Take 17 g by mouth daily.  Modified Medications   Modified Medication Previous Medication   DICLOFENAC SODIUM (VOLTAREN) 1 % GEL diclofenac sodium (VOLTAREN) 1 % GEL      Apply 2 g topically 4 (four) times daily as needed.    Apply 2 g topically 4 (four) times daily as needed.   OMEPRAZOLE (PRILOSEC) 20 MG CAPSULE omeprazole (PRILOSEC) 40 MG capsule      Take 1 capsule (20 mg total) by mouth daily.    Take 1 capsule (40 mg total) by mouth daily.  Discontinued Medications   CELECOXIB (CELEBREX) 100 MG CAPSULE    Take 1 capsule (100 mg total) by mouth 2 (two) times daily as needed.   HYDRALAZINE (APRESOLINE) 25 MG TABLET    TAKE 1 TABLET (25 MG TOTAL) BY MOUTH 2 (TWO) TIMES DAILY.    Review of Systems  Constitutional: Positive for appetite change. Negative for fever, chills, diaphoresis, activity change and fatigue.  HENT: Negative for ear pain, sore throat and trouble swallowing.   Eyes: Negative for visual disturbance.  Respiratory: Negative for cough, chest tightness and shortness of breath.   Cardiovascular: Negative for chest pain, palpitations and leg swelling.  Gastrointestinal: Negative for nausea, vomiting, abdominal pain, diarrhea, constipation and blood in stool.  Genitourinary: Negative for dysuria and enuresis.  Musculoskeletal: Positive for gait problem and neck pain. Negative for arthralgias.  Skin: Negative for rash.  Neurological: Negative for dizziness, tremors, numbness and headaches.  Psychiatric/Behavioral: Negative for sleep disturbance. The patient is not nervous/anxious.       Filed Vitals:   09/28/14 1620  BP: 101/74  Pulse: 73  Temp: 98.2 F (36.8 C)  Weight: 70 lb (31.752 kg)   Body mass index is 13.67 kg/(m^2).  Physical Exam  Constitutional: She is oriented to person, place, and time. She appears well-developed and well-nourished.  HENT:  Mouth/Throat: Oropharynx is clear and moist. No oropharyngeal exudate.  Eyes: Pupils are equal, round, and reactive to light. No scleral icterus.  Neck: Neck supple. No tracheal deviation present.  No thyromegaly present.  Cardiovascular: Normal rate, regular rhythm, normal heart sounds and intact distal pulses.  Exam reveals no gallop and no friction rub.   No murmur heard. No LE edema b/l. no calf TTP. No carotid bruit b/l  Pulmonary/Chest: Effort normal and breath sounds normal. No stridor. No respiratory distress. She has no wheezes. She has no rales.  Abdominal: Soft. Bowel sounds are normal. She exhibits no distension and no mass. There is no tenderness. There is no rebound and no guarding.  Lymphadenopathy:    She has no cervical adenopathy.  Neurological: She is alert and oriented to person, place, and time. She has normal reflexes.  Skin: Skin is warm and dry. No rash noted.  Left neck egg sized freely mobile cyst without d/c or redness  Psychiatric: She has a normal mood and affect. Her behavior is normal. Judgment and thought content normal.     Labs reviewed: Admission on 09/22/2014, Discharged on 09/23/2014  Component Date Value Ref Range Status  . WBC 09/22/2014 8.0  4.0 - 10.5 K/uL Final  . RBC 09/22/2014 3.87  3.87 - 5.11 MIL/uL Final  . Hemoglobin 09/22/2014 12.1  12.0 - 15.0 g/dL Final  . HCT 09/22/2014 37.6  36.0 - 46.0 % Final  . MCV 09/22/2014 97.2  78.0 - 100.0 fL Final  . MCH 09/22/2014 31.3  26.0 - 34.0 pg Final  . MCHC 09/22/2014 32.2  30.0 - 36.0 g/dL Final  . RDW 09/22/2014 14.3  11.5 - 15.5 % Final  . Platelets 09/22/2014 306  150 - 400 K/uL Final  . Sodium 09/22/2014 144   135 - 145 mmol/L Final  . Potassium 09/22/2014 4.9  3.5 - 5.1 mmol/L Final   HEMOLYZED SPECIMEN, RESULTS MAY BE AFFECTED  . Chloride 09/22/2014 109  96 - 112 mmol/L Final  . CO2 09/22/2014 25  19 - 32 mmol/L Final  . Glucose, Bld 09/22/2014 101* 70 - 99 mg/dL Final  . BUN 09/22/2014 11  6 - 23 mg/dL Final  . Creatinine, Ser 09/22/2014 0.67  0.50 - 1.10 mg/dL Final  . Calcium 09/22/2014 10.0  8.4 - 10.5 mg/dL Final  . Total Protein 09/22/2014 8.0  6.0 - 8.3 g/dL Final  . Albumin 09/22/2014 4.4  3.5 - 5.2 g/dL Final  . AST 09/22/2014 30  0 - 37 U/L Final  . ALT 09/22/2014 13  0 - 35 U/L Final  . Alkaline Phosphatase 09/22/2014 34* 39 - 117 U/L Final  . Total Bilirubin 09/22/2014 1.2  0.3 - 1.2 mg/dL Final  . GFR calc non Af Amer 09/22/2014 75* >90 mL/min Final  . GFR calc Af Amer 09/22/2014 87* >90 mL/min Final   Comment: (NOTE) The eGFR has been calculated using the CKD EPI equation. This calculation has not been validated in all clinical situations. eGFR's persistently <90 mL/min signify possible Chronic Kidney Disease.   . Anion gap 09/22/2014 10  5 - 15 Final  . Color, Urine 09/22/2014 YELLOW  YELLOW Final  . APPearance 09/22/2014 CLEAR  CLEAR Final  . Specific Gravity, Urine 09/22/2014 1.020  1.005 - 1.030 Final  . pH 09/22/2014 7.0  5.0 - 8.0 Final  . Glucose, UA 09/22/2014 NEGATIVE  NEGATIVE mg/dL Final  . Hgb urine dipstick 09/22/2014 NEGATIVE  NEGATIVE Final  . Bilirubin Urine 09/22/2014 NEGATIVE  NEGATIVE Final  . Ketones, ur 09/22/2014 NEGATIVE  NEGATIVE mg/dL Final  . Protein, ur 09/22/2014 30* NEGATIVE mg/dL Final  . Urobilinogen, UA 09/22/2014 1.0  0.0 -  1.0 mg/dL Final  . Nitrite 25/41/7361 NEGATIVE  NEGATIVE Final  . Leukocytes, UA 09/22/2014 NEGATIVE  NEGATIVE Final  . Specimen Description 09/22/2014 URINE, CATHETERIZED   Final  . Special Requests 09/22/2014 NONE   Final  . Colony Count 09/22/2014    Final                   Value:80,000 COLONIES/ML Performed at  [facility]   . Culture 09/22/2014    Final                   Value:ESCHERICHIA COLI Performed at [facility]   . Report Status 09/22/2014 09/24/2014 FINAL   Final  . Organism ID, Bacteria 09/22/2014 ESCHERICHIA COLI   Final  . Troponin I 09/22/2014 0.06* <0.031 ng/mL Final   Comment:        PERSISTENTLY INCREASED TROPONIN VALUES IN THE RANGE OF 0.04-0.49 ng/mL CAN BE SEEN IN:       -UNSTABLE ANGINA       -CONGESTIVE HEART FAILURE       -MYOCARDITIS       -CHEST TRAUMA       -ARRYHTHMIAS       -LATE PRESENTING MYOCARDIAL INFARCTION       -COPD   CLINICAL FOLLOW-UP RECOMMENDED.   Marland Kitchen Squamous Epithelial / LPF 09/22/2014 RARE  RARE Final  . WBC, UA 09/22/2014 0-2  <3 WBC/hpf Final  . Bacteria, UA 09/22/2014 RARE  RARE Final  . Troponin I 09/22/2014 <0.03  <0.031 ng/mL Final   Comment:        NO INDICATION OF MYOCARDIAL INJURY.   . Troponin I 09/22/2014 0.03  <0.031 ng/mL Final   Comment:        NO INDICATION OF MYOCARDIAL INJURY.   . TSH 09/22/2014 7.179* 0.350 - 4.500 uIU/mL Final  . Magnesium 09/22/2014 2.0  1.5 - 2.5 mg/dL Final  . Phosphorus 38/01/4846 3.1  2.3 - 4.6 mg/dL Final  . Lactic Acid, Venous 09/22/2014 0.7  0.5 - 2.0 mmol/L Final  . Hgb A1c MFr Bld 09/22/2014 5.6  4.8 - 5.6 % Final   Comment: (NOTE)         Pre-diabetes: 5.7 - 6.4         Diabetes: >6.4         Glycemic control for adults with diabetes: <7.0   . Mean Plasma Glucose 09/22/2014 114   Final   Comment: (NOTE) Performed At: [facility] [address] [place], [place] [id number] Mila Homer MD VQ:[id number]   . MRSA by PCR 09/22/2014 NEGATIVE  NEGATIVE Final   Comment:        The GeneXpert MRSA Assay (FDA approved for NASAL specimens only), is one component of a comprehensive MRSA colonization surveillance program. It is not intended to diagnose MRSA infection nor to guide or monitor treatment for MRSA infections.   . Prothrombin Time 09/22/2014 12.7   11.6 - 15.2 seconds Final  . INR 09/22/2014 0.95  0.00 - 1.49 Final  . Alcohol, Ethyl (B) 09/22/2014 <5  0 - 9 mg/dL Final   Comment:        LOWEST DETECTABLE LIMIT FOR SERUM ALCOHOL IS 11 mg/dL FOR MEDICAL PURPOSES ONLY   . Vitamin B-12 09/22/2014 406  211 - 911 pg/mL Final   Performed at [facility]  . Folate 09/22/2014 17.2   Final   Comment: (NOTE) Reference Ranges        Deficient:  0.4 - 3.3 ng/mL        Indeterminate:   3.4 - 5.4 ng/mL        Normal:              > 5.4 ng/mL Performed at [facility]   . Acetaminophen (Tylenol), Serum 09/22/2014 <10.0* 10 - 30 ug/mL Final   Comment:        THERAPEUTIC CONCENTRATIONS VARY SIGNIFICANTLY. A RANGE OF 10-30 ug/mL MAY BE AN EFFECTIVE CONCENTRATION FOR MANY PATIENTS. HOWEVER, SOME ARE BEST TREATED AT CONCENTRATIONS OUTSIDE THIS RANGE. ACETAMINOPHEN CONCENTRATIONS >150 ug/mL AT 4 HOURS AFTER INGESTION AND >50 ug/mL AT 12 HOURS AFTER INGESTION ARE OFTEN ASSOCIATED WITH TOXIC REACTIONS.   . Salicylate Lvl 62/69/4854 <4.0  2.8 - 20.0 mg/dL Final  . Opiates 09/22/2014 NONE DETECTED  NONE DETECTED Final  . Cocaine 09/22/2014 NONE DETECTED  NONE DETECTED Final  . Benzodiazepines 09/22/2014 NONE DETECTED  NONE DETECTED Final  . Amphetamines 09/22/2014 NONE DETECTED  NONE DETECTED Final  . Tetrahydrocannabinol 09/22/2014 NONE DETECTED  NONE DETECTED Final  . Barbiturates 09/22/2014 NONE DETECTED  NONE DETECTED Final   Comment:        DRUG SCREEN FOR MEDICAL PURPOSES ONLY.  IF CONFIRMATION IS NEEDED FOR ANY PURPOSE, NOTIFY LAB WITHIN 5 DAYS.        LOWEST DETECTABLE LIMITS FOR URINE DRUG SCREEN Drug Class       Cutoff (ng/mL) Amphetamine      1000 Barbiturate      200 Benzodiazepine   627 Tricyclics       035 Opiates          300 Cocaine          300 THC              50   . Lipase 09/22/2014 36  11 - 59 U/L Final  . Free T4 09/23/2014 0.88  0.80 - 1.80 ng/dL Final   Performed at [facility]  . Sodium 09/23/2014 143  135 - 145 mmol/L Final  . Potassium 09/23/2014 3.8  3.5 - 5.1 mmol/L Final  . Chloride 09/23/2014 110  96 - 112 mmol/L Final  . CO2 09/23/2014 23  19 - 32 mmol/L Final  . Glucose, Bld 09/23/2014 62* 70 - 99 mg/dL Final  . BUN 09/23/2014 10  6 - 23 mg/dL Final  . Creatinine, Ser 09/23/2014 0.66  0.50 - 1.10 mg/dL Final  . Calcium 09/23/2014 9.3  8.4 - 10.5 mg/dL Final  . GFR calc non Af Amer 09/23/2014 75* >90 mL/min Final  . GFR calc Af Amer 09/23/2014 87* >90 mL/min Final   Comment: (NOTE) The eGFR has been calculated using the CKD EPI equation. This calculation has not been validated in all clinical situations. eGFR's persistently <90 mL/min signify possible Chronic Kidney Disease.   . Anion gap 09/23/2014 10  5 - 15 Final  . Vit D, 25-Hydroxy 09/23/2014 25.0* 30.0 - 100.0 ng/mL Final   Comment: (NOTE) Vitamin D deficiency has been defined by the [place] practice guideline as a level of serum 25-OH vitamin D less than 20 ng/mL (1,2). The Endocrine Society went on to further define vitamin D insufficiency as a level between 21 and 29 ng/mL (2). 1. IOM (Institute of Medicine). 2010. Dietary reference   intakes for calcium and D. [place]: The   [employer]. 2. Holick MF, Binkley [place], Bischoff-Ferrari HA, et al.   Evaluation, treatment,  and prevention of vitamin D   deficiency: an Endocrine Society clinical practice   guideline. JCEM. 2011 Jul; 96(7):1911-30. Performed At: [facility] [place], [place] [id number] Lindon Romp MD PP:[id number]   . HIV Screen 4th Generation wRfx 09/23/2014 Non Reactive  Non Reactive Final   Comment: (NOTE) Performed At: [facility] [place], [place] [id number] Lindon Romp MD YZ:[id number]     hospital records reviewed. TSH 7.179. UDS neg. B12 level 406. Head CT showed no acute process. 5cm sized  benign left neck cyst noted. It was stable in appearance and size  Assessment/Plan   ICD-9-CM ICD-10-CM   1. Mass of left side of neck - benign cyst 784.2 R22.1   2. Severe protein-calorie malnutrition - albumin 4.4 with total protein 8 while in the hospital; on nutritional supplement 262 E43   3. Essential hypertension, benign - controlled on amlodipine and hydralazine 401.1 I10   4. At high risk for falls - on fall precautions V15.88 R29.6   5. Hypothyroidism due to acquired atrophy of thyroid - with probable euthyroid sick syndrome on levothyroxine 244.8 E03.8    246.8 E03.4    --reck TSH in 6 weeks  --recommend surgery eval to drain neck cyst  --continue PT/OT/ST as indicated  --will follow  --GOAL: short term rehab and d/c home when medically appropriate. Communicated with pt and nursing.   Tevis Dunavan S. Perlie Gold  [facility] [address] [place], [place] [address] [phone] Office (Wednesdays and Fridays 8 AM - 5 PM) [phone] Cell (Monday-Friday 8 AM - 5 PM)

## 2014-10-25 ENCOUNTER — Other Ambulatory Visit: Payer: Self-pay | Admitting: Family Medicine

## 2014-11-01 ENCOUNTER — Encounter: Payer: Self-pay | Admitting: Pharmacist

## 2014-11-02 ENCOUNTER — Non-Acute Institutional Stay: Payer: 59 | Admitting: Family Medicine

## 2014-11-02 DIAGNOSIS — M79605 Pain in left leg: Secondary | ICD-10-CM

## 2014-11-02 DIAGNOSIS — E034 Atrophy of thyroid (acquired): Secondary | ICD-10-CM

## 2014-11-02 DIAGNOSIS — G8929 Other chronic pain: Secondary | ICD-10-CM | POA: Diagnosis not present

## 2014-11-02 DIAGNOSIS — E43 Unspecified severe protein-calorie malnutrition: Secondary | ICD-10-CM

## 2014-11-02 DIAGNOSIS — I10 Essential (primary) hypertension: Secondary | ICD-10-CM

## 2014-11-02 DIAGNOSIS — F03918 Unspecified dementia, unspecified severity, with other behavioral disturbance: Secondary | ICD-10-CM

## 2014-11-02 DIAGNOSIS — E038 Other specified hypothyroidism: Secondary | ICD-10-CM

## 2014-11-02 DIAGNOSIS — R1310 Dysphagia, unspecified: Secondary | ICD-10-CM | POA: Diagnosis not present

## 2014-11-02 DIAGNOSIS — F0391 Unspecified dementia with behavioral disturbance: Secondary | ICD-10-CM | POA: Diagnosis not present

## 2014-11-02 DIAGNOSIS — M79604 Pain in right leg: Secondary | ICD-10-CM

## 2014-11-02 DIAGNOSIS — L729 Follicular cyst of the skin and subcutaneous tissue, unspecified: Secondary | ICD-10-CM

## 2014-11-02 DIAGNOSIS — K5900 Constipation, unspecified: Secondary | ICD-10-CM

## 2014-11-02 DIAGNOSIS — H409 Unspecified glaucoma: Secondary | ICD-10-CM

## 2014-11-02 NOTE — Progress Notes (Signed)
Subjective:    Patient ID: Kathy Horton, female    DOB: 1921/12/11, 81 y.o.   MRN: [id number]  [facility] 11/02/14  History provided by patient and chart review. Additional clinical information and updates provided by nursing staff at [facility].  HPI  DEMENTIA WITH BEHAVIOR PROBLEM: - No prior established diagnosis of dementia. H/o prior CVA. Recently over past 1 month with increasing agitation and behavior disturbances, mostly at night. Last evaluated for this complaint 10/20/14 with likely working diagnosis dementia given aggressive and inappropriate behaviors. Treatment started with Haldol 0.25 mg daily and 0.25mg  PRN agitation. - Recent work-up for alternative etiologies with Head CT 09/2014 (generalized atrophy), lab work-up negative for metabolic or infectious etiology - Currently patient's behavior seems improved since starting Haldol daily, seems to have demonstrated fewer incidents of aggressive or agitated behavior - Admits to some loneliness, but denies sadness or depressed mood. - Nursing staff denies any psychotic episodes (no hallucinations or abnormal disturbances) - Admits to generalized pain - Denies fevers, dysuria, cough  GENERALIZED PAIN / OSTEOARTHRITIS: - Chronic known history of diffuse OA and osteopenia. Prior imaging over past 3-5 years demonstrating diffuse DJD with CT C-spine, X-ray L-spine, Hip X-rays, Knee X-rays. No prior compression fractures or acute abnormalities. - Reports prior history of falls, unclear most recent maybe >1-2 months ago. No falls during recent hospitalization or current SNF stay - Currently complains of generalized pain with "soreness all over", able to localize some pain to "back and right hip", pain in right hip extends from hip into groin and down to knee (majority of RLE), pain is worse with increased activity - Currently taking Tylenol 325mg  PO scheduled q 6 hr with some relief - Admits to remote history of  opiates for arthritis pain control, unclear how long ago. Previous trials of celebrex and volten gel. - Nursing staff reports occasional "calling out and crying" at night, no longer agitated but thinks that some agitated behaviors might be due to pain - Denies history of hip fracture  CONSTIPATION, CHRONIC: - Reports hard stools with some difficulty. Last BM today, seems to have BM x 1 every 1-2 days. - Current bowel regimen with Miralax 17g daily, Docusate 100mg  PO BID PRN - Denies significant abdominal pain or bloating, bloody stools or rectal bleeding  DYSPHAGIA / WEIGHT LOSS / SEVERE PROTEIN CALORIE MALNUTRITION: - Chronic dysphagia since 2002 following h/o CVA. Prior work-up including barium swallow study with some structural / mechanical swallowing problems. Previously discussed with patient's son regarding further work-up, referral placed to GI for dysphagia eval - Regular SLP therapy and swallow evaluations. Due to visual deficits, required to have feeding assistance. Per nursing, eats 75-100% meals, good appetite and drinks well. - Continues on nectar thickened liquids and pureed diet - Nutrition following regularly. Receiving protein supplement TID - Weight gain +4.4 lb in past 1 month (74.4 lb last wt, from 70 lb)  CHRONIC VISION LOSS, H/o GLAUCOMA: - Chronic poor vision, worsening over past months to year. Previously followed by Ophthalmology for glaucoma. Recent referral made to Ophthalmology  NECK CYST, BENIGN: - Chronic history similar Left sided neck cyst, previously drained by ENT and determined to be benign. Now with apparent recurrence over past 1 year. Referral made to ENT on admission to SNF - Recent apt with ENT 3/11/6 - determined to have 4-5 cm cyst on Left neck anterior to SCM, FNA aspiration performed and indirect laryngoscopy showed clear tongue, epiglottis and vocal cords. No concerns  and diagnosed with benign neck cyst - Resolved. No complaints  today  HYPOTHYROIDISM: - Chronic hypothyroidism, on levothyroxine replacement therapy. Last TSH 7.179 (09/22/14) - Continued on levothyroxine daily  CHRONIC HTN: Reports no complaints Current Meds - Amlodipine  daily (recently reduced from Hydralazine to Amlodipine 10, then from Amlodipine 10 to 5 over past 1 month)   Tolerating well, w/o complaints. Denies CP, dyspnea, HA, edema, dizziness / lightheadedness  CODE STATUS - FULL  I have reviewed and updated the following as appropriate: allergies and current medications  Social Hx: - Former smoker, quit 1990 - Previous resident at [facility] - Contact: Gita Kudo (son, HPOA)  Review of Systems  See above HPI    Objective:   Physical Exam  BP 108/74 mmHg  Pulse 62  Temp(Src) 97.4 F (36.3 C)  Resp 18  Wt 74 lb 6.4 oz (33.748 kg)  Gen - cachectic elderly female, sitting up in bed resting, pleasant and cooperative, NAD HEENT - NCAT, limited eye contact due to poor vision, oropharynx clear, no dentures in, MMM Neck - supple, non-tender, no thyromegaly. Resolved L-neck cyst. Heart - RRR, no murmurs heard Lungs - CTAB, no wheezing, crackles, or rhonchi. Normal work of breathing. Abd - soft, NTND, no masses, no rebound or guarding, +active BS MSK: - Back - generalized tenderness bilateral lower T and L-spine paraspinal muscles without significant asymmetry or deformities. - Right Hip - generalized +TTP over lateral aspect over greater trochanter extending down lateral leg to R-knee, symmetrical to Left, limited ROM due to stiffness and pain - RLE remains hip flexed and knee flexed in bed unable to straighten. No swelling or erythema of RLE. Significant generalized muscle atrophy with very frail bilateral lower ext. Ext - calves non-tender, no edema, peripheral pulses intact +2 b/l Skin - warm, dry, no rashes. No sacral decub or foot ulcerations. Neuro - awake, alert, able to focus with conversation,  difficulty hearing, oriented (person and location only, not date/season/month/year), grossly non-focal, gait not tested  MMSE (adjusted for vision loss) 17/27 >> corrected score out of 30, 18-19/30     Assessment & Plan:   See specific A&P problem list for details.

## 2014-11-03 ENCOUNTER — Other Ambulatory Visit: Payer: Self-pay | Admitting: Family Medicine

## 2014-11-03 ENCOUNTER — Encounter: Payer: Self-pay | Admitting: Family Medicine

## 2014-11-03 MED ORDER — OXYCODONE HCL 5 MG PO TABS: 2.5000 mg | ORAL_TABLET | Freq: Three times a day (TID) | ORAL | Status: DC

## 2014-11-03 MED ORDER — OXYCODONE HCL 5 MG PO TABS: 2.5000 mg | ORAL_TABLET | Freq: Four times a day (QID) | ORAL | Status: DC | PRN

## 2014-11-03 MED ORDER — AMLODIPINE BESYLATE 5 MG PO TABS: 5.0000 mg | ORAL_TABLET | Freq: Every day | ORAL | Status: DC

## 2014-11-03 MED ORDER — SENNA 8.6 MG PO TABS: 2.0000 | ORAL_TABLET | Freq: Every day | ORAL | Status: AC

## 2014-11-04 NOTE — Assessment & Plan Note (Signed)
Resolved. Chronic history of recurrent Left neck cyst, previously determined benign. - Recently referred back to ENT - last visit 10/22/14 (Dr. Ezzard StandingNewman) s/p FNA aspiration and drainage of cyst, also indirect laryngoscopy negative for abnormalities or masses  Plan: 1. Monitor for recurrence, if needed re-consult ENT for repeat drainage

## 2014-11-04 NOTE — Assessment & Plan Note (Signed)
Chronic hypothyroidism, on levothyroxine replacement therapy - Last TSH 7.179 (09/22/14)  Plan: 1. Continued on levothyroxine 75mcg daily 2. Re-check TSH in about 2 months

## 2014-11-04 NOTE — Assessment & Plan Note (Signed)
Chronic poor vision with history of glaucoma - Previously followed by Ophthalmology, had been taken off eye drops within past year  Plan: 1. Recent Ophthalmology referral placed to return for evaluation given continued worsening vision - currently pending 2. Continue full assist for meals and ADLs 3. At high risk for falls with poor vision, may trigger behavior problems

## 2014-11-04 NOTE — Assessment & Plan Note (Signed)
Continued chronic constipation for past several years. Concern for worsening constipation now with starting chronic opiates for pain control.  Plan: 1. Start Senokot 2 tabs qhs scheduled 2. Continue Miralax 17g daily, Docusate 100mg  BID PRN - may need to schedule docusate in future

## 2014-11-04 NOTE — Assessment & Plan Note (Signed)
Well-controlled HTN. Well within goal for elderly 89 yr pt < 150/90. Concern for too aggressive BP and risk hypotension previously reduced regimen No complications   Plan:  1. Continue Amlodipine 5mg  daily (previously on Hydralazine 25 and Amlodipine 10) 2. Monitor

## 2014-11-04 NOTE — Assessment & Plan Note (Signed)
Chronic dysphagia since 2002 following h/o CVA. Prior work-up including barium swallow study (2002), also per chart review repeat modified swallow study in 10/2011 with some structural / swallowing phase problems. - Previously improved on current pureed and nectar thick liquid diet, however has had increasing weight loss over past 1 year - Prior eval 07/2014 determined that continued GI eval and repeat swallow study appropriate for goals to improve nutritional status, however pt deemed unlikely surgical candidate for more aggressive intervention  Plan: 1. Recent GI referral for dysphagia evaluation and possible barium swallow study placed on admission to [facility] - currently pending 2. Continue regular SLP therapy, full mealtime assistance 3. Continue current pureed / NTL diet

## 2014-11-04 NOTE — Assessment & Plan Note (Signed)
Chronic known history of diffuse OA and osteopenia, in setting of chronic debilitation with muscle atrophy and limited activity, likely etiology for chronic MSK pain. - Prior imaging over past 3-5 years demonstrating diffuse DJD with CT C-spine, X-ray L-spine, Hip X-rays, Knee X-rays. No prior compression fractures or acute abnormalities. - Possible increased pain following prior falls >1-2 months ago. No falls during recent hospitalization or current SNF stay - Concern with increased pain contributing to trigger behavior problems, agitation, now seems to have episodes of crying out in pain instead of agitated behavior now on haldol  1. Start Oxycodone 2.5mg  PO scheduled TID with Oxycodone 2.5mg  PO q 6 hr PRN breakthrough severe pain 2. Increase bowel regimen after starting opiates 3. Continue Tylenol 325mg  PO scheduled q 6 hr 4. Continue therapy with regular skilled PT/OT 5. Remains full assist for all ADLs 6. Continue to monitor pain control, titrate opiates as tolerated, consider switch to long-acting MS Contin in future if controlled

## 2014-11-04 NOTE — Assessment & Plan Note (Addendum)
Considered new diagnosis dementia with behavior problem (mostly inappropriate behavior), with cognitive impairment (confirmed on MMSE) limited ADLs and function. Behavior seems improved on Haldol. - Recent work-up negative for alternative metabolic or infectious etiology. Likely dementia with prior CNS insult with h/o CVA, last head CT with generalized atrophy (09/2014). Additionally, behaviors not due to delirium (however may have some episodes, likely due to pain or hospitalization), psychotic disorder (no +psychotic features). Possible component of mood disorder with depression, some admitted loneliness. - Treatment - Haldol trial x few weeks to see if reduced agitation / inappropriate behaviors - Concern for increased pain as trigger for behavior problems / agitation  Plan: 1. Continue Haldol 0.25mg  daily + 0.25mg  qhs PRN - likely continue for total 4-6 weeks, then consider trial off to see if improved 2. Performed MMSE (adjusted for vision) - score 17/27 - (0/5 orientation to any time, 0/3 recall, limited by hearing on attention testing, unable to perform visual tasks), consistent with diagnosis of dementia 3. Increase pain control with starting scheduled opiates in addition to regular Tylenol 4. Consider trial in future of SSRI for possible depression contributing to dementia, however unlikely to entirely reverse problem

## 2014-11-04 NOTE — Assessment & Plan Note (Signed)
See note on "Chronic Pain"

## 2014-11-04 NOTE — Assessment & Plan Note (Signed)
Recent improvement with weight gain +4.4 lb in past 1 month (74.4 lb last wt, from 70 lb). Previously with about 20 lb wt loss in 1 year (91 lb in 08/2013) - Eating 75-100% meals, drinking well  Plan: 1. Continue regular Nutrition following 2. Continue current pureed diet / NTL, daily protein supplement TID 3. Work-up dysphagia

## 2014-11-15 ENCOUNTER — Telehealth: Payer: Self-pay | Admitting: Family Medicine

## 2014-11-15 NOTE — Telephone Encounter (Signed)
Opened in error

## 2014-11-16 ENCOUNTER — Encounter: Payer: Self-pay | Admitting: Family Medicine

## 2014-11-17 NOTE — Progress Notes (Signed)
I discussed with  Dr Althea CharonKaramalegos.  I agree with their plans documented in their regulatory visit note.  Ms Kathy Horton has not displayed behaviors that endanger herself or others since stopping Haldol on 10/30/14.

## 2014-11-22 DIAGNOSIS — I10 Essential (primary) hypertension: Secondary | ICD-10-CM | POA: Diagnosis not present

## 2014-11-22 DIAGNOSIS — R269 Unspecified abnormalities of gait and mobility: Secondary | ICD-10-CM | POA: Diagnosis not present

## 2014-11-30 DIAGNOSIS — R1312 Dysphagia, oropharyngeal phase: Secondary | ICD-10-CM | POA: Diagnosis not present

## 2014-11-30 DIAGNOSIS — Z9181 History of falling: Secondary | ICD-10-CM | POA: Diagnosis not present

## 2014-11-30 DIAGNOSIS — M810 Age-related osteoporosis without current pathological fracture: Secondary | ICD-10-CM | POA: Diagnosis not present

## 2014-11-30 DIAGNOSIS — R419 Unspecified symptoms and signs involving cognitive functions and awareness: Secondary | ICD-10-CM | POA: Diagnosis not present

## 2014-11-30 DIAGNOSIS — G9340 Encephalopathy, unspecified: Secondary | ICD-10-CM | POA: Diagnosis not present

## 2014-11-30 DIAGNOSIS — E039 Hypothyroidism, unspecified: Secondary | ICD-10-CM | POA: Diagnosis not present

## 2014-11-30 DIAGNOSIS — E43 Unspecified severe protein-calorie malnutrition: Secondary | ICD-10-CM | POA: Diagnosis not present

## 2014-11-30 DIAGNOSIS — R296 Repeated falls: Secondary | ICD-10-CM | POA: Diagnosis not present

## 2014-11-30 DIAGNOSIS — N3946 Mixed incontinence: Secondary | ICD-10-CM | POA: Diagnosis not present

## 2014-11-30 DIAGNOSIS — I10 Essential (primary) hypertension: Secondary | ICD-10-CM | POA: Diagnosis not present

## 2014-11-30 DIAGNOSIS — B029 Zoster without complications: Secondary | ICD-10-CM | POA: Diagnosis not present

## 2014-11-30 DIAGNOSIS — R488 Other symbolic dysfunctions: Secondary | ICD-10-CM | POA: Diagnosis not present

## 2014-11-30 DIAGNOSIS — I6930 Unspecified sequelae of cerebral infarction: Secondary | ICD-10-CM | POA: Diagnosis not present

## 2014-11-30 DIAGNOSIS — M6281 Muscle weakness (generalized): Secondary | ICD-10-CM | POA: Diagnosis not present

## 2014-11-30 DIAGNOSIS — K219 Gastro-esophageal reflux disease without esophagitis: Secondary | ICD-10-CM | POA: Diagnosis not present

## 2014-12-01 DIAGNOSIS — N3946 Mixed incontinence: Secondary | ICD-10-CM | POA: Diagnosis not present

## 2014-12-01 DIAGNOSIS — R419 Unspecified symptoms and signs involving cognitive functions and awareness: Secondary | ICD-10-CM | POA: Diagnosis not present

## 2014-12-01 DIAGNOSIS — R488 Other symbolic dysfunctions: Secondary | ICD-10-CM | POA: Diagnosis not present

## 2014-12-01 DIAGNOSIS — R296 Repeated falls: Secondary | ICD-10-CM | POA: Diagnosis not present

## 2014-12-01 DIAGNOSIS — E43 Unspecified severe protein-calorie malnutrition: Secondary | ICD-10-CM | POA: Diagnosis not present

## 2014-12-01 DIAGNOSIS — K219 Gastro-esophageal reflux disease without esophagitis: Secondary | ICD-10-CM | POA: Diagnosis not present

## 2014-12-01 DIAGNOSIS — M810 Age-related osteoporosis without current pathological fracture: Secondary | ICD-10-CM | POA: Diagnosis not present

## 2014-12-01 DIAGNOSIS — Z9181 History of falling: Secondary | ICD-10-CM | POA: Diagnosis not present

## 2014-12-01 DIAGNOSIS — M6281 Muscle weakness (generalized): Secondary | ICD-10-CM | POA: Diagnosis not present

## 2014-12-01 DIAGNOSIS — B029 Zoster without complications: Secondary | ICD-10-CM | POA: Diagnosis not present

## 2014-12-01 DIAGNOSIS — I6930 Unspecified sequelae of cerebral infarction: Secondary | ICD-10-CM | POA: Diagnosis not present

## 2014-12-01 DIAGNOSIS — R1312 Dysphagia, oropharyngeal phase: Secondary | ICD-10-CM | POA: Diagnosis not present

## 2014-12-01 DIAGNOSIS — E039 Hypothyroidism, unspecified: Secondary | ICD-10-CM | POA: Diagnosis not present

## 2014-12-01 DIAGNOSIS — I10 Essential (primary) hypertension: Secondary | ICD-10-CM | POA: Diagnosis not present

## 2014-12-01 DIAGNOSIS — G9340 Encephalopathy, unspecified: Secondary | ICD-10-CM | POA: Diagnosis not present

## 2014-12-02 DIAGNOSIS — I10 Essential (primary) hypertension: Secondary | ICD-10-CM | POA: Diagnosis not present

## 2014-12-02 DIAGNOSIS — K219 Gastro-esophageal reflux disease without esophagitis: Secondary | ICD-10-CM | POA: Diagnosis not present

## 2014-12-02 DIAGNOSIS — N3946 Mixed incontinence: Secondary | ICD-10-CM | POA: Diagnosis not present

## 2014-12-02 DIAGNOSIS — M6281 Muscle weakness (generalized): Secondary | ICD-10-CM | POA: Diagnosis not present

## 2014-12-02 DIAGNOSIS — I6930 Unspecified sequelae of cerebral infarction: Secondary | ICD-10-CM | POA: Diagnosis not present

## 2014-12-02 DIAGNOSIS — R419 Unspecified symptoms and signs involving cognitive functions and awareness: Secondary | ICD-10-CM | POA: Diagnosis not present

## 2014-12-02 DIAGNOSIS — E43 Unspecified severe protein-calorie malnutrition: Secondary | ICD-10-CM | POA: Diagnosis not present

## 2014-12-02 DIAGNOSIS — R488 Other symbolic dysfunctions: Secondary | ICD-10-CM | POA: Diagnosis not present

## 2014-12-02 DIAGNOSIS — R296 Repeated falls: Secondary | ICD-10-CM | POA: Diagnosis not present

## 2014-12-02 DIAGNOSIS — M810 Age-related osteoporosis without current pathological fracture: Secondary | ICD-10-CM | POA: Diagnosis not present

## 2014-12-02 DIAGNOSIS — E039 Hypothyroidism, unspecified: Secondary | ICD-10-CM | POA: Diagnosis not present

## 2014-12-02 DIAGNOSIS — R1312 Dysphagia, oropharyngeal phase: Secondary | ICD-10-CM | POA: Diagnosis not present

## 2014-12-02 DIAGNOSIS — B029 Zoster without complications: Secondary | ICD-10-CM | POA: Diagnosis not present

## 2014-12-02 DIAGNOSIS — G9340 Encephalopathy, unspecified: Secondary | ICD-10-CM | POA: Diagnosis not present

## 2014-12-02 DIAGNOSIS — Z9181 History of falling: Secondary | ICD-10-CM | POA: Diagnosis not present

## 2014-12-03 DIAGNOSIS — B029 Zoster without complications: Secondary | ICD-10-CM | POA: Diagnosis not present

## 2014-12-03 DIAGNOSIS — Z9181 History of falling: Secondary | ICD-10-CM | POA: Diagnosis not present

## 2014-12-03 DIAGNOSIS — R1312 Dysphagia, oropharyngeal phase: Secondary | ICD-10-CM | POA: Diagnosis not present

## 2014-12-03 DIAGNOSIS — E039 Hypothyroidism, unspecified: Secondary | ICD-10-CM | POA: Diagnosis not present

## 2014-12-03 DIAGNOSIS — G9340 Encephalopathy, unspecified: Secondary | ICD-10-CM | POA: Diagnosis not present

## 2014-12-03 DIAGNOSIS — M6281 Muscle weakness (generalized): Secondary | ICD-10-CM | POA: Diagnosis not present

## 2014-12-03 DIAGNOSIS — M810 Age-related osteoporosis without current pathological fracture: Secondary | ICD-10-CM | POA: Diagnosis not present

## 2014-12-03 DIAGNOSIS — I10 Essential (primary) hypertension: Secondary | ICD-10-CM | POA: Diagnosis not present

## 2014-12-03 DIAGNOSIS — N3946 Mixed incontinence: Secondary | ICD-10-CM | POA: Diagnosis not present

## 2014-12-03 DIAGNOSIS — I6930 Unspecified sequelae of cerebral infarction: Secondary | ICD-10-CM | POA: Diagnosis not present

## 2014-12-03 DIAGNOSIS — R488 Other symbolic dysfunctions: Secondary | ICD-10-CM | POA: Diagnosis not present

## 2014-12-03 DIAGNOSIS — K219 Gastro-esophageal reflux disease without esophagitis: Secondary | ICD-10-CM | POA: Diagnosis not present

## 2014-12-03 DIAGNOSIS — R296 Repeated falls: Secondary | ICD-10-CM | POA: Diagnosis not present

## 2014-12-03 DIAGNOSIS — E43 Unspecified severe protein-calorie malnutrition: Secondary | ICD-10-CM | POA: Diagnosis not present

## 2014-12-03 DIAGNOSIS — R419 Unspecified symptoms and signs involving cognitive functions and awareness: Secondary | ICD-10-CM | POA: Diagnosis not present

## 2014-12-04 DIAGNOSIS — E43 Unspecified severe protein-calorie malnutrition: Secondary | ICD-10-CM | POA: Diagnosis not present

## 2014-12-04 DIAGNOSIS — G9340 Encephalopathy, unspecified: Secondary | ICD-10-CM | POA: Diagnosis not present

## 2014-12-04 DIAGNOSIS — B029 Zoster without complications: Secondary | ICD-10-CM | POA: Diagnosis not present

## 2014-12-04 DIAGNOSIS — M6281 Muscle weakness (generalized): Secondary | ICD-10-CM | POA: Diagnosis not present

## 2014-12-04 DIAGNOSIS — R488 Other symbolic dysfunctions: Secondary | ICD-10-CM | POA: Diagnosis not present

## 2014-12-04 DIAGNOSIS — I6930 Unspecified sequelae of cerebral infarction: Secondary | ICD-10-CM | POA: Diagnosis not present

## 2014-12-04 DIAGNOSIS — M810 Age-related osteoporosis without current pathological fracture: Secondary | ICD-10-CM | POA: Diagnosis not present

## 2014-12-04 DIAGNOSIS — N3946 Mixed incontinence: Secondary | ICD-10-CM | POA: Diagnosis not present

## 2014-12-04 DIAGNOSIS — E039 Hypothyroidism, unspecified: Secondary | ICD-10-CM | POA: Diagnosis not present

## 2014-12-04 DIAGNOSIS — I10 Essential (primary) hypertension: Secondary | ICD-10-CM | POA: Diagnosis not present

## 2014-12-04 DIAGNOSIS — Z9181 History of falling: Secondary | ICD-10-CM | POA: Diagnosis not present

## 2014-12-04 DIAGNOSIS — R419 Unspecified symptoms and signs involving cognitive functions and awareness: Secondary | ICD-10-CM | POA: Diagnosis not present

## 2014-12-04 DIAGNOSIS — R1312 Dysphagia, oropharyngeal phase: Secondary | ICD-10-CM | POA: Diagnosis not present

## 2014-12-04 DIAGNOSIS — R296 Repeated falls: Secondary | ICD-10-CM | POA: Diagnosis not present

## 2014-12-04 DIAGNOSIS — K219 Gastro-esophageal reflux disease without esophagitis: Secondary | ICD-10-CM | POA: Diagnosis not present

## 2014-12-06 ENCOUNTER — Encounter: Payer: Self-pay | Admitting: Pharmacist

## 2014-12-06 DIAGNOSIS — M810 Age-related osteoporosis without current pathological fracture: Secondary | ICD-10-CM | POA: Diagnosis not present

## 2014-12-06 DIAGNOSIS — R296 Repeated falls: Secondary | ICD-10-CM | POA: Diagnosis not present

## 2014-12-06 DIAGNOSIS — Z9181 History of falling: Secondary | ICD-10-CM | POA: Diagnosis not present

## 2014-12-06 DIAGNOSIS — B029 Zoster without complications: Secondary | ICD-10-CM | POA: Diagnosis not present

## 2014-12-06 DIAGNOSIS — K219 Gastro-esophageal reflux disease without esophagitis: Secondary | ICD-10-CM | POA: Diagnosis not present

## 2014-12-06 DIAGNOSIS — I6930 Unspecified sequelae of cerebral infarction: Secondary | ICD-10-CM | POA: Diagnosis not present

## 2014-12-06 DIAGNOSIS — G9340 Encephalopathy, unspecified: Secondary | ICD-10-CM | POA: Diagnosis not present

## 2014-12-06 DIAGNOSIS — N3946 Mixed incontinence: Secondary | ICD-10-CM | POA: Diagnosis not present

## 2014-12-06 DIAGNOSIS — R488 Other symbolic dysfunctions: Secondary | ICD-10-CM | POA: Diagnosis not present

## 2014-12-06 DIAGNOSIS — R1312 Dysphagia, oropharyngeal phase: Secondary | ICD-10-CM | POA: Diagnosis not present

## 2014-12-06 DIAGNOSIS — M6281 Muscle weakness (generalized): Secondary | ICD-10-CM | POA: Diagnosis not present

## 2014-12-06 DIAGNOSIS — E43 Unspecified severe protein-calorie malnutrition: Secondary | ICD-10-CM | POA: Diagnosis not present

## 2014-12-06 DIAGNOSIS — I10 Essential (primary) hypertension: Secondary | ICD-10-CM | POA: Diagnosis not present

## 2014-12-06 DIAGNOSIS — E039 Hypothyroidism, unspecified: Secondary | ICD-10-CM | POA: Diagnosis not present

## 2014-12-06 DIAGNOSIS — R419 Unspecified symptoms and signs involving cognitive functions and awareness: Secondary | ICD-10-CM | POA: Diagnosis not present

## 2014-12-07 DIAGNOSIS — M6281 Muscle weakness (generalized): Secondary | ICD-10-CM | POA: Diagnosis not present

## 2014-12-07 DIAGNOSIS — M810 Age-related osteoporosis without current pathological fracture: Secondary | ICD-10-CM | POA: Diagnosis not present

## 2014-12-07 DIAGNOSIS — R296 Repeated falls: Secondary | ICD-10-CM | POA: Diagnosis not present

## 2014-12-07 DIAGNOSIS — E43 Unspecified severe protein-calorie malnutrition: Secondary | ICD-10-CM | POA: Diagnosis not present

## 2014-12-07 DIAGNOSIS — I10 Essential (primary) hypertension: Secondary | ICD-10-CM | POA: Diagnosis not present

## 2014-12-07 DIAGNOSIS — N3946 Mixed incontinence: Secondary | ICD-10-CM | POA: Diagnosis not present

## 2014-12-07 DIAGNOSIS — E039 Hypothyroidism, unspecified: Secondary | ICD-10-CM | POA: Diagnosis not present

## 2014-12-07 DIAGNOSIS — R488 Other symbolic dysfunctions: Secondary | ICD-10-CM | POA: Diagnosis not present

## 2014-12-07 DIAGNOSIS — G9340 Encephalopathy, unspecified: Secondary | ICD-10-CM | POA: Diagnosis not present

## 2014-12-07 DIAGNOSIS — B029 Zoster without complications: Secondary | ICD-10-CM | POA: Diagnosis not present

## 2014-12-07 DIAGNOSIS — R1312 Dysphagia, oropharyngeal phase: Secondary | ICD-10-CM | POA: Diagnosis not present

## 2014-12-07 DIAGNOSIS — R419 Unspecified symptoms and signs involving cognitive functions and awareness: Secondary | ICD-10-CM | POA: Diagnosis not present

## 2014-12-07 DIAGNOSIS — K219 Gastro-esophageal reflux disease without esophagitis: Secondary | ICD-10-CM | POA: Diagnosis not present

## 2014-12-07 DIAGNOSIS — I6930 Unspecified sequelae of cerebral infarction: Secondary | ICD-10-CM | POA: Diagnosis not present

## 2014-12-07 DIAGNOSIS — Z9181 History of falling: Secondary | ICD-10-CM | POA: Diagnosis not present

## 2014-12-08 DIAGNOSIS — R296 Repeated falls: Secondary | ICD-10-CM | POA: Diagnosis not present

## 2014-12-08 DIAGNOSIS — M810 Age-related osteoporosis without current pathological fracture: Secondary | ICD-10-CM | POA: Diagnosis not present

## 2014-12-08 DIAGNOSIS — G9340 Encephalopathy, unspecified: Secondary | ICD-10-CM | POA: Diagnosis not present

## 2014-12-08 DIAGNOSIS — N3946 Mixed incontinence: Secondary | ICD-10-CM | POA: Diagnosis not present

## 2014-12-08 DIAGNOSIS — Z9181 History of falling: Secondary | ICD-10-CM | POA: Diagnosis not present

## 2014-12-08 DIAGNOSIS — E43 Unspecified severe protein-calorie malnutrition: Secondary | ICD-10-CM | POA: Diagnosis not present

## 2014-12-08 DIAGNOSIS — R419 Unspecified symptoms and signs involving cognitive functions and awareness: Secondary | ICD-10-CM | POA: Diagnosis not present

## 2014-12-08 DIAGNOSIS — I6930 Unspecified sequelae of cerebral infarction: Secondary | ICD-10-CM | POA: Diagnosis not present

## 2014-12-08 DIAGNOSIS — I10 Essential (primary) hypertension: Secondary | ICD-10-CM | POA: Diagnosis not present

## 2014-12-08 DIAGNOSIS — R488 Other symbolic dysfunctions: Secondary | ICD-10-CM | POA: Diagnosis not present

## 2014-12-08 DIAGNOSIS — R1312 Dysphagia, oropharyngeal phase: Secondary | ICD-10-CM | POA: Diagnosis not present

## 2014-12-08 DIAGNOSIS — B029 Zoster without complications: Secondary | ICD-10-CM | POA: Diagnosis not present

## 2014-12-08 DIAGNOSIS — K219 Gastro-esophageal reflux disease without esophagitis: Secondary | ICD-10-CM | POA: Diagnosis not present

## 2014-12-08 DIAGNOSIS — M6281 Muscle weakness (generalized): Secondary | ICD-10-CM | POA: Diagnosis not present

## 2014-12-08 DIAGNOSIS — E039 Hypothyroidism, unspecified: Secondary | ICD-10-CM | POA: Diagnosis not present

## 2014-12-09 DIAGNOSIS — I6930 Unspecified sequelae of cerebral infarction: Secondary | ICD-10-CM | POA: Diagnosis not present

## 2014-12-09 DIAGNOSIS — G9340 Encephalopathy, unspecified: Secondary | ICD-10-CM | POA: Diagnosis not present

## 2014-12-09 DIAGNOSIS — K219 Gastro-esophageal reflux disease without esophagitis: Secondary | ICD-10-CM | POA: Diagnosis not present

## 2014-12-09 DIAGNOSIS — R1312 Dysphagia, oropharyngeal phase: Secondary | ICD-10-CM | POA: Diagnosis not present

## 2014-12-09 DIAGNOSIS — M810 Age-related osteoporosis without current pathological fracture: Secondary | ICD-10-CM | POA: Diagnosis not present

## 2014-12-09 DIAGNOSIS — I10 Essential (primary) hypertension: Secondary | ICD-10-CM | POA: Diagnosis not present

## 2014-12-09 DIAGNOSIS — Z9181 History of falling: Secondary | ICD-10-CM | POA: Diagnosis not present

## 2014-12-09 DIAGNOSIS — N3946 Mixed incontinence: Secondary | ICD-10-CM | POA: Diagnosis not present

## 2014-12-09 DIAGNOSIS — R488 Other symbolic dysfunctions: Secondary | ICD-10-CM | POA: Diagnosis not present

## 2014-12-09 DIAGNOSIS — B029 Zoster without complications: Secondary | ICD-10-CM | POA: Diagnosis not present

## 2014-12-09 DIAGNOSIS — M6281 Muscle weakness (generalized): Secondary | ICD-10-CM | POA: Diagnosis not present

## 2014-12-09 DIAGNOSIS — R419 Unspecified symptoms and signs involving cognitive functions and awareness: Secondary | ICD-10-CM | POA: Diagnosis not present

## 2014-12-09 DIAGNOSIS — E43 Unspecified severe protein-calorie malnutrition: Secondary | ICD-10-CM | POA: Diagnosis not present

## 2014-12-09 DIAGNOSIS — R296 Repeated falls: Secondary | ICD-10-CM | POA: Diagnosis not present

## 2014-12-09 DIAGNOSIS — E039 Hypothyroidism, unspecified: Secondary | ICD-10-CM | POA: Diagnosis not present

## 2014-12-10 DIAGNOSIS — M810 Age-related osteoporosis without current pathological fracture: Secondary | ICD-10-CM | POA: Diagnosis not present

## 2014-12-10 DIAGNOSIS — I10 Essential (primary) hypertension: Secondary | ICD-10-CM | POA: Diagnosis not present

## 2014-12-10 DIAGNOSIS — R296 Repeated falls: Secondary | ICD-10-CM | POA: Diagnosis not present

## 2014-12-10 DIAGNOSIS — E43 Unspecified severe protein-calorie malnutrition: Secondary | ICD-10-CM | POA: Diagnosis not present

## 2014-12-10 DIAGNOSIS — B029 Zoster without complications: Secondary | ICD-10-CM | POA: Diagnosis not present

## 2014-12-10 DIAGNOSIS — R488 Other symbolic dysfunctions: Secondary | ICD-10-CM | POA: Diagnosis not present

## 2014-12-10 DIAGNOSIS — R1312 Dysphagia, oropharyngeal phase: Secondary | ICD-10-CM | POA: Diagnosis not present

## 2014-12-10 DIAGNOSIS — E039 Hypothyroidism, unspecified: Secondary | ICD-10-CM | POA: Diagnosis not present

## 2014-12-10 DIAGNOSIS — R419 Unspecified symptoms and signs involving cognitive functions and awareness: Secondary | ICD-10-CM | POA: Diagnosis not present

## 2014-12-10 DIAGNOSIS — K219 Gastro-esophageal reflux disease without esophagitis: Secondary | ICD-10-CM | POA: Diagnosis not present

## 2014-12-10 DIAGNOSIS — I6930 Unspecified sequelae of cerebral infarction: Secondary | ICD-10-CM | POA: Diagnosis not present

## 2014-12-10 DIAGNOSIS — M6281 Muscle weakness (generalized): Secondary | ICD-10-CM | POA: Diagnosis not present

## 2014-12-10 DIAGNOSIS — G9340 Encephalopathy, unspecified: Secondary | ICD-10-CM | POA: Diagnosis not present

## 2014-12-10 DIAGNOSIS — N3946 Mixed incontinence: Secondary | ICD-10-CM | POA: Diagnosis not present

## 2014-12-10 DIAGNOSIS — Z9181 History of falling: Secondary | ICD-10-CM | POA: Diagnosis not present

## 2014-12-13 DIAGNOSIS — R262 Difficulty in walking, not elsewhere classified: Secondary | ICD-10-CM | POA: Diagnosis not present

## 2014-12-13 DIAGNOSIS — I6930 Unspecified sequelae of cerebral infarction: Secondary | ICD-10-CM | POA: Diagnosis not present

## 2014-12-14 DIAGNOSIS — I6930 Unspecified sequelae of cerebral infarction: Secondary | ICD-10-CM | POA: Diagnosis not present

## 2014-12-14 DIAGNOSIS — R262 Difficulty in walking, not elsewhere classified: Secondary | ICD-10-CM | POA: Diagnosis not present

## 2014-12-15 DIAGNOSIS — I6930 Unspecified sequelae of cerebral infarction: Secondary | ICD-10-CM | POA: Diagnosis not present

## 2014-12-15 DIAGNOSIS — R262 Difficulty in walking, not elsewhere classified: Secondary | ICD-10-CM | POA: Diagnosis not present

## 2014-12-15 DIAGNOSIS — L988 Other specified disorders of the skin and subcutaneous tissue: Secondary | ICD-10-CM | POA: Diagnosis not present

## 2014-12-16 DIAGNOSIS — R262 Difficulty in walking, not elsewhere classified: Secondary | ICD-10-CM | POA: Diagnosis not present

## 2014-12-16 DIAGNOSIS — I6930 Unspecified sequelae of cerebral infarction: Secondary | ICD-10-CM | POA: Diagnosis not present

## 2014-12-17 DIAGNOSIS — I6930 Unspecified sequelae of cerebral infarction: Secondary | ICD-10-CM | POA: Diagnosis not present

## 2014-12-17 DIAGNOSIS — R262 Difficulty in walking, not elsewhere classified: Secondary | ICD-10-CM | POA: Diagnosis not present

## 2014-12-18 DIAGNOSIS — I6930 Unspecified sequelae of cerebral infarction: Secondary | ICD-10-CM | POA: Diagnosis not present

## 2014-12-18 DIAGNOSIS — R262 Difficulty in walking, not elsewhere classified: Secondary | ICD-10-CM | POA: Diagnosis not present

## 2014-12-19 DIAGNOSIS — R262 Difficulty in walking, not elsewhere classified: Secondary | ICD-10-CM | POA: Diagnosis not present

## 2014-12-19 DIAGNOSIS — I6930 Unspecified sequelae of cerebral infarction: Secondary | ICD-10-CM | POA: Diagnosis not present

## 2014-12-20 DIAGNOSIS — R262 Difficulty in walking, not elsewhere classified: Secondary | ICD-10-CM | POA: Diagnosis not present

## 2014-12-20 DIAGNOSIS — I6930 Unspecified sequelae of cerebral infarction: Secondary | ICD-10-CM | POA: Diagnosis not present

## 2014-12-21 ENCOUNTER — Encounter: Payer: Self-pay | Admitting: Family Medicine

## 2014-12-21 ENCOUNTER — Non-Acute Institutional Stay: Payer: 59 | Admitting: Family Medicine

## 2014-12-21 DIAGNOSIS — F0391 Unspecified dementia with behavioral disturbance: Secondary | ICD-10-CM

## 2014-12-21 DIAGNOSIS — I10 Essential (primary) hypertension: Secondary | ICD-10-CM

## 2014-12-21 DIAGNOSIS — G8929 Other chronic pain: Secondary | ICD-10-CM

## 2014-12-21 DIAGNOSIS — R1310 Dysphagia, unspecified: Secondary | ICD-10-CM

## 2014-12-21 DIAGNOSIS — H409 Unspecified glaucoma: Secondary | ICD-10-CM

## 2014-12-21 DIAGNOSIS — I6930 Unspecified sequelae of cerebral infarction: Secondary | ICD-10-CM | POA: Diagnosis not present

## 2014-12-21 DIAGNOSIS — E43 Unspecified severe protein-calorie malnutrition: Secondary | ICD-10-CM

## 2014-12-21 DIAGNOSIS — E038 Other specified hypothyroidism: Secondary | ICD-10-CM

## 2014-12-21 DIAGNOSIS — M24569 Contracture, unspecified knee: Secondary | ICD-10-CM

## 2014-12-21 DIAGNOSIS — F03918 Unspecified dementia, unspecified severity, with other behavioral disturbance: Secondary | ICD-10-CM

## 2014-12-21 DIAGNOSIS — L89899 Pressure ulcer of other site, unspecified stage: Secondary | ICD-10-CM

## 2014-12-21 DIAGNOSIS — K5900 Constipation, unspecified: Secondary | ICD-10-CM

## 2014-12-21 DIAGNOSIS — E034 Atrophy of thyroid (acquired): Secondary | ICD-10-CM

## 2014-12-21 DIAGNOSIS — R262 Difficulty in walking, not elsewhere classified: Secondary | ICD-10-CM | POA: Diagnosis not present

## 2014-12-21 NOTE — Progress Notes (Signed)
Subjective:    Patient ID: Kathy Horton, female    DOB: 1922/03/11, 81 y.o.   MRN: [id number]  [facility] Visit - Room 304A 12/21/14  History provided by patient (limited by dementia) and chart review. Additional clinical information and updates provided by nursing staff at [name].  HPI  DEMENTIA WITH BEHAVIOR PROBLEM: - Recent (within past 2-3 months) new established diagnosis dementia with behavior problems, related to agitation, seemed to be triggered by increased pain - Previously treated with trial of Haldol 0.25mg  PO daily with additional 0.25mg  qhs PRN since 09/2014 and discontinued on 10/30/14 due to not further demonstrated behaviors endangering herself or other residents - Per nursing staff report, patient's current behavior has been stable after stopping Haldol. They do admit that she continues to have episodes of "crying out" mostly seems related to generalized pain, which has improved on opiate pain regimen - Admits to some loneliness without depression or sadness - Nursing staff denies any psychotic episodes - Admits generalized pain (see below) - Denies any fevers/chills, HA  GENERALIZED PAIN / OSTEOARTHRITIS / KNEE FLEXION CONTRACTURES: - Chronic known history of diffuse OA and osteopenia. Prior imaging over past 3-5 years demonstrating diffuse DJD with CT C-spine, X-ray L-spine, Hip X-rays, Knee X-rays - Continues to complain of generalized pain with "soreness all over", often localizes pain to bilateral knees and back. Worse with activity or prolonged positioning. - Additionally, bilateral knees with progressive chronic flexion contractures, Left significantly worse than Right. Continues to work with PT regularly for str and extension exercises. Also using dynamic knee splint to promote extension - Seems to have had some improvement following starting chronic opiates but remains in chronic pain - Currently taking Oxycodone 2.5mg  PO q 6 hr PRN,  seems to be getting regular doses about 4 times daily. Also taking Tylenol 325mg  PO scheduled q 6 hr - Discontinued Lidoderm patch (not covered by ins) initially used for shingles - Nursing staff reports continues to "call out in pain" more commonly at night, but no longer has episodes of agitation due to pain - Denies red/swollen joints, trauma, injury, or fall during current SNF stay  DYSPHAGIA / WEIGHT LOSS / SEVERE PROTEIN CALORIE MALNUTRITION: - Chronic dysphagia since 2002 following h/o CVA. Prior work-up including barium swallow study with some structural / mechanical swallowing problems. - Currently seems to be tolerating PO well without reported concerns by nursing or SLP. Denies choking, gagging, nausea, vomiting - Continues to receive regular SLP therapy and swallow evals. Due to visual deficits, required to have feeding assistance with max verbal cues. Per nursing, eats 50-100% meals, good appetite and drinks well. - Continues on nectar thickened liquids and pureed diet - Nutrition following regularly. Receiving protein supplement TID - Previously on trial Remeron 7.5mg  qhs x 1 month for appetite - since discontinued - Weight loss -4 lbs in past 2 months. Down to 70.0 lb from 74.4 lb (similar to wt on admission)  PRESSURE ULCERS, LOWER EXT: - Patient has had development of pressure ulcers on lower ext due to limited mobility and knee flexion contractures. At risk due to severe protein malnutrition - Currently with R-lateral thigh and L-lower inner leg. Both are being followed by wound care services with regular Santyl ointment, daily dry dressings applied - No evidence of surrounding cellulitis. No drainage.  CONSTIPATION, CHRONIC: - Last BM today, seems to have BM x 1 every 1-2 days. - Current bowel regimen with Miralax 17g daily, Docusate 100mg  PO BID PRN,  also added Senokot 2 tabs qhs scheduled at last visit after start opiates - Denies significant abdominal pain or bloating,  bloody stools or rectal bleeding  CHRONIC VISION LOSS, H/o GLAUCOMA: - Chronic poor vision with history of glaucoma. - Seems to be gradually worsening. Continues to require total assist per nursing  HYPOTHYROIDISM: - Chronic hypothyroidism, on levothyroxine replacement therapy. Last TSH 7.179 (09/22/14) - Continued on levothyroxine 75mcg daily  CHRONIC HTN: Reports no complaints Current Meds - Amlodipine 2.5mg  daily (reduced from 5mg )  Tolerating well, w/o complaints. Denies CP, dyspnea, HA, edema, dizziness / lightheadedness  CODE STATUS - FULL  I have reviewed and updated the following as appropriate: allergies and current medications  Social Hx: - Former smoker, quit 1990 - Previous resident at [facility] - Contact: Gita Kudoufus Brooks (son, HPOA)  Review of Systems  See above HPI    Objective:   Physical Exam  BP 147/75 mmHg  Pulse 79  Temp(Src) 97.2 F (36.2 C) (Oral)  Resp 18  Wt 70 lb (31.752 kg)  Gen - cachectic elderly female, resting in bed, pleasant and cooperative, NAD HEENT - NCAT, limited eye contact due to poor vision, no dentures in, MMM Neck - supple, non-tender, no thyromegaly Heart - RRR, no murmurs heard Lungs - CTAB, no wheezing, crackles, or rhonchi. Normal work of breathing. Abd - soft, NTND, no masses, no rebound or guarding, +active BS MSK: significant generalized muscle mass atrophy - Back - +TTP bilateral lower T and L-spine paraspinal muscles without significant asymmetry or deformities. - LLE - Significant knee flexion contracture about 85 degrees. limited ROM due to stiffness and pain. No joint swelling or erythema. - RLE - Mild knee flexion contracture about 30 degrees, limited ROM due to stiffness and pain. No joint swelling or erythema. Ext - no edema, peripheral pulses intact +2 b/l Skin - warm, dry, no rashes. Pressure Ulcerations - 2x2 cm currently with adhesive dressings (dry and intact) in place R-lateral thigh and L-lower  inner leg. Neuro - awake, alert, limited focus on conversation. Difficulty hearing, oriented (person and city only), grossly non-focal, gait not tested     Assessment & Plan:   See specific A&P problem list for details.

## 2014-12-22 DIAGNOSIS — I10 Essential (primary) hypertension: Secondary | ICD-10-CM | POA: Diagnosis not present

## 2014-12-22 DIAGNOSIS — I6930 Unspecified sequelae of cerebral infarction: Secondary | ICD-10-CM | POA: Diagnosis not present

## 2014-12-22 DIAGNOSIS — R262 Difficulty in walking, not elsewhere classified: Secondary | ICD-10-CM | POA: Diagnosis not present

## 2014-12-22 DIAGNOSIS — R269 Unspecified abnormalities of gait and mobility: Secondary | ICD-10-CM | POA: Diagnosis not present

## 2014-12-22 DIAGNOSIS — L988 Other specified disorders of the skin and subcutaneous tissue: Secondary | ICD-10-CM | POA: Diagnosis not present

## 2014-12-22 DIAGNOSIS — L89899 Pressure ulcer of other site, unspecified stage: Secondary | ICD-10-CM | POA: Insufficient documentation

## 2014-12-22 NOTE — Assessment & Plan Note (Signed)
Recent weight loss -4 lb in past 1-2 months (to 70 lb from 74.4 lb). Previously with about 20 lb wt loss in 1 year (91 lb in 08/2013) - Eating 50-100% meals, drinking well - No improvement on trial Remeron (discontinued)  Plan: 1. Continue regular Nutrition following 2. Continue current pureed diet / NTL, daily protein supplement TID 3. Work-up dysphagia

## 2014-12-22 NOTE — Assessment & Plan Note (Addendum)
Bilateral knee flexion contractures (Left  85* > Right 30*), likely source of significant pain in setting of diffuse OA, generalized muscle atrophy, limited mobility. - See note on "Chronic Pain"  Plan: 1. Continue pain management as above. 2. PT working with knee extension and mobility, using dynamic knee splints periodically as tolerated

## 2014-12-22 NOTE — Assessment & Plan Note (Signed)
Stable b/l pressure ulcers on lower ext due to limited mobility and knee flexion contractures. At risk due to severe protein malnutrition - Currently with R-lateral thigh and L-lower inner leg. No evidence of acute infection or complication.  Plan: 1. Continue regular wound care services, Santyl ointment, daily dressing changes

## 2014-12-22 NOTE — Assessment & Plan Note (Signed)
Stable chronic constipation. Remains controlled even in setting of chronic opiates (started 1-2 months ago)  Plan: 1. Continue Miralax 17g daily, Senokot 2 tabs qhs scheduled, Docusate 100mg  BID PRN 2. Recommend scheduling docusate in future if problems.

## 2014-12-22 NOTE — Assessment & Plan Note (Addendum)
Stable chronic known h/o diffuse OA and osteopenia, in setting of chronic debilitation with muscle atrophy and limited activity, likely etiology for chronic MSK pain. - Improved on chronic opiates. Still with persistent complaints of pain. No further significant behavioral disturbances. - Complicated by bilateral knee flexion contractures - Currently on Oxycodone 2.5mg  PO q 6 hr PRN = Total daily dose oxycodone 10mg  (which converts to Morphine PO 15mg  daily)  Plan: 1. Increase to Oxycodone 5mg  PO q 6 hr PRN - Discontinue Oxycodone 2.5mg  PO q 6 hr PRN 2. Continue Tylenol 325mg  PO scheduled q 6 hr 3. Continue daily bowel regimen 4. Continue therapy with regular skilled PT/OT. Working on improve LE mobility with dynamic knee splints. 5. Remains full assist for all ADLs 6. Continue to monitor pain control, consider switch to long-acting MS Contin in future if controlled

## 2014-12-22 NOTE — Assessment & Plan Note (Signed)
Well-controlled HTN. At goal, < 150/90. No complications   Plan:  1. Continue Amlodipine 2.5mg  daily (previously reduced from Amlodipine 5mg  daily) 2. Monitor

## 2014-12-22 NOTE — Assessment & Plan Note (Signed)
Stable. Improved agitation behavior. Chronic dementia with h/o behavior disturbances. Remains total assist, unable to perform ADL/IADLs - Completed trial of Haldol 0.25mg  PO daily x 4 weeks. Stable behavior since discontinued 3/19 - Remains on chronic opiates, but still believe that chronic pain would be most likely trigger for any potential behavior issues or agitation  Plan: 1. Routine care, regular re-orientation 2. Continue chronic pain control with opiates and scheduled Tylenol 3. Consider trial in future of SSRI for possible depression contributing to dementia, however unlikely to entirely reverse problem

## 2014-12-22 NOTE — Assessment & Plan Note (Signed)
Chronic poor vision with history of glaucoma - Previously followed by Ophthalmology  Plan: 1. Pending Ophthalmology referral placed to return for evaluation given continued worsening vision 2. Continue full assist for meals and ADLs 3. At high risk for falls with poor vision, may trigger behavior problems

## 2014-12-22 NOTE — Assessment & Plan Note (Addendum)
Chronic dysphagia since 2002 (s/p CVA), without worsening or complications.  Plan: 1. Continue current diet with pureed foods, nectar thickened liquids 2. Pending arranging GI referral for dysphagia evaluation and possible barium swallow study - placed on admission to [facility] 3. Continue regular SLP therapy, full mealtime assistance

## 2014-12-22 NOTE — Assessment & Plan Note (Addendum)
Chronic hypothyroidism, on levothyroxine replacement therapy - Last TSH 7.179 (09/22/14)  Plan: 1. Continued on levothyroxine 75mcg daily 2. Check TSH - titrate Levothyroxine accordingly

## 2014-12-23 ENCOUNTER — Encounter: Payer: Self-pay | Admitting: *Deleted

## 2014-12-23 ENCOUNTER — Other Ambulatory Visit: Payer: Self-pay | Admitting: *Deleted

## 2014-12-23 DIAGNOSIS — R262 Difficulty in walking, not elsewhere classified: Secondary | ICD-10-CM | POA: Diagnosis not present

## 2014-12-23 DIAGNOSIS — I6930 Unspecified sequelae of cerebral infarction: Secondary | ICD-10-CM | POA: Diagnosis not present

## 2014-12-23 MED ORDER — OXYCODONE HCL 5 MG PO TABS: ORAL_TABLET | ORAL | Status: AC

## 2014-12-24 DIAGNOSIS — R262 Difficulty in walking, not elsewhere classified: Secondary | ICD-10-CM | POA: Diagnosis not present

## 2014-12-24 DIAGNOSIS — I6930 Unspecified sequelae of cerebral infarction: Secondary | ICD-10-CM | POA: Diagnosis not present

## 2014-12-27 DIAGNOSIS — R262 Difficulty in walking, not elsewhere classified: Secondary | ICD-10-CM | POA: Diagnosis not present

## 2014-12-27 DIAGNOSIS — I6930 Unspecified sequelae of cerebral infarction: Secondary | ICD-10-CM | POA: Diagnosis not present

## 2014-12-28 DIAGNOSIS — R262 Difficulty in walking, not elsewhere classified: Secondary | ICD-10-CM | POA: Diagnosis not present

## 2014-12-28 DIAGNOSIS — I6930 Unspecified sequelae of cerebral infarction: Secondary | ICD-10-CM | POA: Diagnosis not present

## 2014-12-29 DIAGNOSIS — R262 Difficulty in walking, not elsewhere classified: Secondary | ICD-10-CM | POA: Diagnosis not present

## 2014-12-29 DIAGNOSIS — L988 Other specified disorders of the skin and subcutaneous tissue: Secondary | ICD-10-CM | POA: Diagnosis not present

## 2014-12-29 DIAGNOSIS — I6930 Unspecified sequelae of cerebral infarction: Secondary | ICD-10-CM | POA: Diagnosis not present

## 2014-12-30 DIAGNOSIS — R262 Difficulty in walking, not elsewhere classified: Secondary | ICD-10-CM | POA: Diagnosis not present

## 2014-12-30 DIAGNOSIS — I6930 Unspecified sequelae of cerebral infarction: Secondary | ICD-10-CM | POA: Diagnosis not present

## 2014-12-31 DIAGNOSIS — R262 Difficulty in walking, not elsewhere classified: Secondary | ICD-10-CM | POA: Diagnosis not present

## 2014-12-31 DIAGNOSIS — I6930 Unspecified sequelae of cerebral infarction: Secondary | ICD-10-CM | POA: Diagnosis not present

## 2015-01-01 DIAGNOSIS — R262 Difficulty in walking, not elsewhere classified: Secondary | ICD-10-CM | POA: Diagnosis not present

## 2015-01-01 DIAGNOSIS — I6930 Unspecified sequelae of cerebral infarction: Secondary | ICD-10-CM | POA: Diagnosis not present

## 2015-01-03 ENCOUNTER — Encounter: Payer: Self-pay | Admitting: Student-PharmD

## 2015-01-03 DIAGNOSIS — I6930 Unspecified sequelae of cerebral infarction: Secondary | ICD-10-CM | POA: Diagnosis not present

## 2015-01-03 DIAGNOSIS — R262 Difficulty in walking, not elsewhere classified: Secondary | ICD-10-CM | POA: Diagnosis not present

## 2015-01-04 DIAGNOSIS — I6930 Unspecified sequelae of cerebral infarction: Secondary | ICD-10-CM | POA: Diagnosis not present

## 2015-01-04 DIAGNOSIS — R262 Difficulty in walking, not elsewhere classified: Secondary | ICD-10-CM | POA: Diagnosis not present

## 2015-01-05 DIAGNOSIS — L988 Other specified disorders of the skin and subcutaneous tissue: Secondary | ICD-10-CM | POA: Diagnosis not present

## 2015-01-05 DIAGNOSIS — I6930 Unspecified sequelae of cerebral infarction: Secondary | ICD-10-CM | POA: Diagnosis not present

## 2015-01-05 DIAGNOSIS — R262 Difficulty in walking, not elsewhere classified: Secondary | ICD-10-CM | POA: Diagnosis not present

## 2015-01-06 DIAGNOSIS — I6930 Unspecified sequelae of cerebral infarction: Secondary | ICD-10-CM | POA: Diagnosis not present

## 2015-01-06 DIAGNOSIS — R262 Difficulty in walking, not elsewhere classified: Secondary | ICD-10-CM | POA: Diagnosis not present

## 2015-01-07 DIAGNOSIS — R262 Difficulty in walking, not elsewhere classified: Secondary | ICD-10-CM | POA: Diagnosis not present

## 2015-01-07 DIAGNOSIS — I6930 Unspecified sequelae of cerebral infarction: Secondary | ICD-10-CM | POA: Diagnosis not present

## 2015-01-08 DIAGNOSIS — R262 Difficulty in walking, not elsewhere classified: Secondary | ICD-10-CM | POA: Diagnosis not present

## 2015-01-08 DIAGNOSIS — I6930 Unspecified sequelae of cerebral infarction: Secondary | ICD-10-CM | POA: Diagnosis not present

## 2015-01-09 DIAGNOSIS — R262 Difficulty in walking, not elsewhere classified: Secondary | ICD-10-CM | POA: Diagnosis not present

## 2015-01-09 DIAGNOSIS — I6930 Unspecified sequelae of cerebral infarction: Secondary | ICD-10-CM | POA: Diagnosis not present

## 2015-01-10 DIAGNOSIS — I6930 Unspecified sequelae of cerebral infarction: Secondary | ICD-10-CM | POA: Diagnosis not present

## 2015-01-10 DIAGNOSIS — R262 Difficulty in walking, not elsewhere classified: Secondary | ICD-10-CM | POA: Diagnosis not present

## 2015-01-11 DIAGNOSIS — I6930 Unspecified sequelae of cerebral infarction: Secondary | ICD-10-CM | POA: Diagnosis not present

## 2015-01-11 DIAGNOSIS — R262 Difficulty in walking, not elsewhere classified: Secondary | ICD-10-CM | POA: Diagnosis not present

## 2015-01-12 DIAGNOSIS — R262 Difficulty in walking, not elsewhere classified: Secondary | ICD-10-CM | POA: Diagnosis not present

## 2015-01-12 DIAGNOSIS — I6930 Unspecified sequelae of cerebral infarction: Secondary | ICD-10-CM | POA: Diagnosis not present

## 2015-01-13 DIAGNOSIS — R262 Difficulty in walking, not elsewhere classified: Secondary | ICD-10-CM | POA: Diagnosis not present

## 2015-01-13 DIAGNOSIS — I6930 Unspecified sequelae of cerebral infarction: Secondary | ICD-10-CM | POA: Diagnosis not present

## 2015-01-14 DIAGNOSIS — R262 Difficulty in walking, not elsewhere classified: Secondary | ICD-10-CM | POA: Diagnosis not present

## 2015-01-14 DIAGNOSIS — L988 Other specified disorders of the skin and subcutaneous tissue: Secondary | ICD-10-CM | POA: Diagnosis not present

## 2015-01-14 DIAGNOSIS — I6930 Unspecified sequelae of cerebral infarction: Secondary | ICD-10-CM | POA: Diagnosis not present

## 2015-01-15 DIAGNOSIS — E039 Hypothyroidism, unspecified: Secondary | ICD-10-CM | POA: Diagnosis not present

## 2015-01-17 ENCOUNTER — Encounter: Payer: Self-pay | Admitting: Family Medicine

## 2015-01-17 DIAGNOSIS — R262 Difficulty in walking, not elsewhere classified: Secondary | ICD-10-CM | POA: Diagnosis not present

## 2015-01-17 DIAGNOSIS — I6930 Unspecified sequelae of cerebral infarction: Secondary | ICD-10-CM | POA: Diagnosis not present

## 2015-01-17 LAB — TSH: TSH: 1.233 (ref 0.350–4.50)

## 2015-01-17 NOTE — Progress Notes (Signed)
I have seen and examined this patient. I have reviewed labs and imaging results.  I have discussed with Dr Malachi ParadiseKaramelegos.  I agree with their findings and plans as documented in their Regulatory  note.

## 2015-01-18 DIAGNOSIS — R262 Difficulty in walking, not elsewhere classified: Secondary | ICD-10-CM | POA: Diagnosis not present

## 2015-01-18 DIAGNOSIS — I6930 Unspecified sequelae of cerebral infarction: Secondary | ICD-10-CM | POA: Diagnosis not present

## 2015-01-19 DIAGNOSIS — L988 Other specified disorders of the skin and subcutaneous tissue: Secondary | ICD-10-CM | POA: Diagnosis not present

## 2015-01-19 DIAGNOSIS — R262 Difficulty in walking, not elsewhere classified: Secondary | ICD-10-CM | POA: Diagnosis not present

## 2015-01-19 DIAGNOSIS — I6930 Unspecified sequelae of cerebral infarction: Secondary | ICD-10-CM | POA: Diagnosis not present

## 2015-01-22 DIAGNOSIS — I10 Essential (primary) hypertension: Secondary | ICD-10-CM | POA: Diagnosis not present

## 2015-01-22 DIAGNOSIS — R269 Unspecified abnormalities of gait and mobility: Secondary | ICD-10-CM | POA: Diagnosis not present

## 2015-01-26 DIAGNOSIS — L988 Other specified disorders of the skin and subcutaneous tissue: Secondary | ICD-10-CM | POA: Diagnosis not present

## 2015-02-02 DIAGNOSIS — L988 Other specified disorders of the skin and subcutaneous tissue: Secondary | ICD-10-CM | POA: Diagnosis not present

## 2015-02-03 ENCOUNTER — Other Ambulatory Visit: Payer: Self-pay | Admitting: Family Medicine

## 2015-02-03 MED ORDER — OXYCODONE HCL 5 MG PO TABS: 5.0000 mg | ORAL_TABLET | Freq: Three times a day (TID) | ORAL | Status: AC

## 2015-02-04 ENCOUNTER — Encounter: Payer: Self-pay | Admitting: Pharmacist

## 2015-02-07 ENCOUNTER — Telehealth: Payer: Self-pay | Admitting: Family Medicine

## 2015-02-07 ENCOUNTER — Emergency Department (HOSPITAL_COMMUNITY)
Admission: EM | Admit: 2015-02-07 | Discharge: 2015-02-11 | Disposition: E | Payer: Medicare Other | Attending: Emergency Medicine | Admitting: Emergency Medicine

## 2015-02-07 ENCOUNTER — Encounter (HOSPITAL_COMMUNITY): Payer: Self-pay | Admitting: *Deleted

## 2015-02-07 DIAGNOSIS — I469 Cardiac arrest, cause unspecified: Secondary | ICD-10-CM | POA: Diagnosis not present

## 2015-02-07 DIAGNOSIS — Z880 Allergy status to penicillin: Secondary | ICD-10-CM | POA: Insufficient documentation

## 2015-02-07 DIAGNOSIS — E079 Disorder of thyroid, unspecified: Secondary | ICD-10-CM | POA: Insufficient documentation

## 2015-02-07 DIAGNOSIS — Z87891 Personal history of nicotine dependence: Secondary | ICD-10-CM | POA: Insufficient documentation

## 2015-02-07 DIAGNOSIS — H409 Unspecified glaucoma: Secondary | ICD-10-CM | POA: Insufficient documentation

## 2015-02-07 DIAGNOSIS — I10 Essential (primary) hypertension: Secondary | ICD-10-CM | POA: Diagnosis not present

## 2015-02-07 DIAGNOSIS — Z862 Personal history of diseases of the blood and blood-forming organs and certain disorders involving the immune mechanism: Secondary | ICD-10-CM | POA: Insufficient documentation

## 2015-02-07 DIAGNOSIS — Z8673 Personal history of transient ischemic attack (TIA), and cerebral infarction without residual deficits: Secondary | ICD-10-CM | POA: Diagnosis not present

## 2015-02-07 DIAGNOSIS — Z79899 Other long term (current) drug therapy: Secondary | ICD-10-CM | POA: Diagnosis not present

## 2015-02-07 LAB — I-STAT ARTERIAL BLOOD GAS, ED
Acid-base deficit: 10 mmol/L — ABNORMAL HIGH (ref 0.0–2.0)
BICARBONATE: 17.6 meq/L — AB (ref 20.0–24.0)
O2 Saturation: 84 %
TCO2: 19 mmol/L (ref 0–100)
pCO2 arterial: 46.6 mmHg — ABNORMAL HIGH (ref 35.0–45.0)
pH, Arterial: 7.185 — CL (ref 7.350–7.450)
pO2, Arterial: 60 mmHg — ABNORMAL LOW (ref 80.0–100.0)

## 2015-02-07 MED ORDER — EPINEPHRINE HCL 0.1 MG/ML IJ SOSY
PREFILLED_SYRINGE | INTRAMUSCULAR | Status: DC | PRN
  Administered 2015-02-07: 1 via INTRAVENOUS

## 2015-02-07 MED FILL — Medication: Qty: 1 | Status: AC

## 2015-02-08 LAB — I-STAT ARTERIAL BLOOD GAS, ED
Acid-base deficit: 6 mmol/L — ABNORMAL HIGH (ref 0.0–2.0)
Bicarbonate: 21.5 mEq/L (ref 20.0–24.0)
O2 SAT: 100 %
TCO2: 23 mmol/L (ref 0–100)
pCO2 arterial: 54.2 mmHg — ABNORMAL HIGH (ref 35.0–45.0)
pH, Arterial: 7.207 — ABNORMAL LOW (ref 7.350–7.450)
pO2, Arterial: 233 mmHg — ABNORMAL HIGH (ref 80.0–100.0)

## 2015-02-11 NOTE — Code Documentation (Signed)
Pulse check. Asystole. 

## 2015-02-11 NOTE — Code Documentation (Signed)
Pt here via [facility] from [place].  After eating lunch pt was taken to room.  When staff checked on pt at 1412 and found pt conscious in bed, though gurgling.  When EMS arrived pupils were fixed and dilated, apneic and brady in 30's.  Within 1 minute pt went into V-tach for 15 sec and then alternated between PEA (rate of 20) and asystole.  5 epis with no response.  CBG 279.

## 2015-02-11 NOTE — ED Provider Notes (Signed)
CSN: [id number]     Arrival date & time March 22, 2015  1452 History   First MD Initiated Contact with Patient 0August 09, 2016 1502     No chief complaint on file.    (Consider location/radiation/quality/duration/timing/severity/associated sxs/prior Treatment) HPI Comments: Patient brought in by EMS as CPR in progress.  On EMS arrival she was unresponsive and lost pulses. ems performed cpr for the past 30 minutes administered 5 epinephrines. patient had a brief episode of ventricular tachycardia they went into PEA. she was not shocked. last 3 pulse checks have been asystole. she is unresponsive. she is a full code by report. there is no report of any chest pain.  EMS had been called out for trouble breathing and patient arrested in front of EMS.  The history is provided by the EMS personnel. The history is limited by the condition of the patient.    Past Medical History  Diagnosis Date  . Glaucoma     recently started on acetazolamide   . HTN (hypertension)     not on any home medications  . Anemia   . Thyroid disease   . Stroke   . Dysphagia   . Mass of left side of neck   . Malnutrition    Past Surgical History  Procedure Laterality Date  . Right wrist    . Eye surgery    . Appendectomy    . Tonsillectomy     Family History  Problem Relation Age of Onset  . Diabetes Mother   . Heart failure Mother   . Hypertension Mother   . Cancer Father    History  Substance Use Topics  . Smoking status: Former Smoker -- 0.00 packs/day for 50 years    Types: Cigarettes    Quit date: 04/15/1989  . Smokeless tobacco: Never Used  . Alcohol Use: No   OB History    No data available     Review of Systems  Unable to perform ROS     Allergies  Penicillins  Home Medications   Prior to Admission medications   Medication Sig Start Date End Date Taking? Authorizing Provider  amLODipine (NORVASC) 2.5 MG tablet Take 2.5 mg by mouth daily.    Historical Provider, MD  docusate sodium  (COLACE) 100 MG capsule Take 1 capsule (100 mg total) by mouth 2 (two) times daily as needed. 09/08/13   Baltazar ApoSamaya J Qureshi, MD  levothyroxine (SYNTHROID, LEVOTHROID) 75 MCG tablet Take 75 mcg by mouth daily before breakfast.    Historical Provider, MD  mirtazapine (REMERON) 15 MG tablet Take 15 mg by mouth at bedtime.    Historical Provider, MD  omeprazole (PRILOSEC) 20 MG capsule Take 1 capsule (20 mg total) by mouth daily. 10/13/14   Glori LuisEric G Sonnenberg, MD  oxyCODONE (OXY IR/ROXICODONE) 5 MG immediate release tablet Take 1/2 tablet by mouth three times daily for pain Patient taking differently: Take 2.5 mg by mouth every 6 (six) hours as needed for breakthrough pain. Take 1/2 tablet by mouth three times daily for pain 12/23/14   Sharon SellerJessica K Eubanks, NP  oxyCODONE (OXY IR/ROXICODONE) 5 MG immediate release tablet Take 1 tablet (5 mg total) by mouth every 8 (eight) hours. 02/03/15   Leighton Roachodd D McDiarmid, MD  polyethylene glycol (MIRALAX / GLYCOLAX) packet Take 17 g by mouth daily. Patient taking differently: Take 17 g by mouth daily as needed for moderate constipation.  10/10/12   Mathis DadMichele T Ho, MD  senna (SENOKOT) 8.6 MG TABS tablet Take 2 tablets (17.2  mg total) by mouth at bedtime. 11/03/14   Glori Luis, MD   Resp 27  Wt 70 lb (31.752 kg) Physical Exam  Constitutional: No distress.  Cachectic, malnourished, contracted  HENT:  Head: Normocephalic and atraumatic.  Eyes:  Pupils fixed and dilated   Neck: Neck supple.  Cardiovascular: Normal rate, regular rhythm and normal heart sounds.   Palpable pulses with compressions  Pulmonary/Chest: Effort normal.  Equal breath sounds with bagging  Abdominal: Soft. There is no tenderness. There is no rebound and no guarding.  Musculoskeletal:  Contracted extremities  Neurological:  Unresponsive No spontaneous movement  Skin: Skin is warm.    ED Course  Procedures (including critical care time) Labs Review Labs Reviewed  I-STAT ARTERIAL BLOOD GAS,  ED - Abnormal; Notable for the following:    pH, Arterial 7.185 (*)    pCO2 arterial 46.6 (*)    pO2, Arterial 60.0 (*)    Bicarbonate 17.6 (*)    Acid-base deficit 10.0 (*)    All other components within normal limits    Imaging Review No results found.   EKG Interpretation None      MDM   Final diagnoses:  Cardiac arrest     CPR in progress brought in by EMS.  CPR has been ongoing for 30 minutes. Last rhythm has been asystole. She is received 5 rounds of epinephrine and CPR continued on arrival for additional cycle one additional epinephrine.   No cardiac activity on ultrasound. The patient has fixed and dilated pupils. Rhythm is asystole. Pronounced at 1458.  D/w [facility] residents.  Dr. McDiarmid patient.  Death certificate to be sent to [facility] office.   patient's son updated.  Cardiopulmonary Resuscitation (CPR) Procedure Note Directed/Performed by: Glynn Octave I personally directed ancillary staff and/or performed CPR in an effort to regain return of spontaneous circulation and to maintain cardiac, neuro and systemic perfusion.   CRITICAL CARE Performed by: Glynn Octave Total critical care time: 30 Critical care time was exclusive of separately billable procedures and treating other patients. Critical care was necessary to treat or prevent imminent or life-threatening deterioration. Critical care was time spent personally by me on the following activities: development of treatment plan with patient and/or surrogate as well as nursing, discussions with consultants, evaluation of patient's response to treatment, examination of patient, obtaining history from patient or surrogate, ordering and performing treatments and interventions, ordering and review of laboratory studies, ordering and review of radiographic studies, pulse oximetry and re-evaluation of patient's condition.     EMERGENCY DEPARTMENT [place] CARDIAC EXAM "Study: Limited Ultrasound of the heart and  pericardium"  INDICATIONS:Cardiac arrest Multiple views of the heart and pericardium are obtained with a multi-frequency probe.  PERFORMED [id number]  IMAGES ARCHIVED?: No  FINDINGS: No pericardial effusion, Tamponade physiology absent and No cardiac activity  LIMITATIONS:  Emergent procedure  VIEWS USED: Subcostal 4 chamber and Parasternal long axis  INTERPRETATION: Cardiac activity absent and Pericardial effusioin absent  COMMENT:     Glynn Octave, MD 02/08/15 [id number]

## 2015-02-11 NOTE — Code Documentation (Signed)
Pulse check.  No pulse.  Cpr resumed.

## 2015-02-11 NOTE — Telephone Encounter (Signed)
Received call from [facility] nursing home concerning patient was having difficulty breathing, and "foaming at the mouth." Nursing felt patient had aspirated. Reported increased respiratory rate with Apneic spells. Rapid heart rate. BP 166/93. Afebrile. Patient is a full code. - Advised nursing staff to call EMS and have her transported to ED immediately, concern for stroke or CV event.  - patient was transported via EMS to ED. Patient reportedly arrested in the presence of EMS and was given CPR, while transporting to ED. Patient passed away after experiencing Vtach and then PEA.  Mauri Tolen DO PGY3

## 2015-02-11 NOTE — Code Documentation (Signed)
Patient time of death occurred at [id number].

## 2015-02-11 DEATH — deceased

## 2015-03-04 NOTE — Telephone Encounter (Signed)
Opened in error

## 2015-09-01 IMAGING — CT CT CERVICAL SPINE W/O CM
4 of 6 series · 12 of 33 positions shown, 14 images · non-contrast
Comparison: 06/11/2013 neck CT.  10/16/2011 head CT

CLINICAL DATA: Confusion for 1 week with possible fall. Initial
encounter.

EXAM:
CT HEAD WITHOUT CONTRAST
CT CERVICAL SPINE WITHOUT CONTRAST
TECHNIQUE: Multidetector CT imaging of the head and cervical spine was
performed following the standard protocol without intravenous
contrast. Multiplanar CT image reconstructions of the cervical spine
were also generated.

[Series 5: c_spine 2.0 i40s 3 · axial · 0.23mm/px · z∈[-198,-148]mm · 2 of 75 slices shown, 3 images]
[im 25/75  soft-tissue]
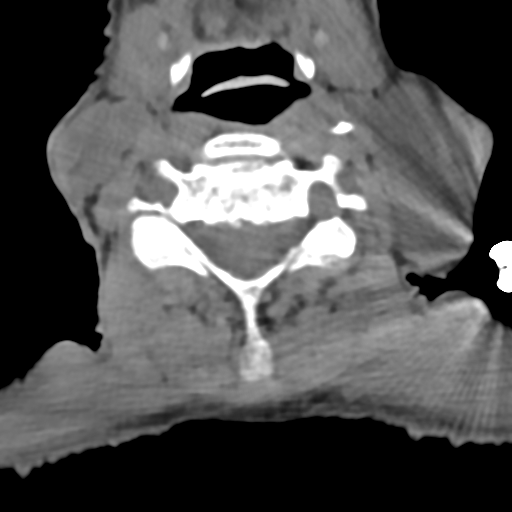
[im 25/75  bone]
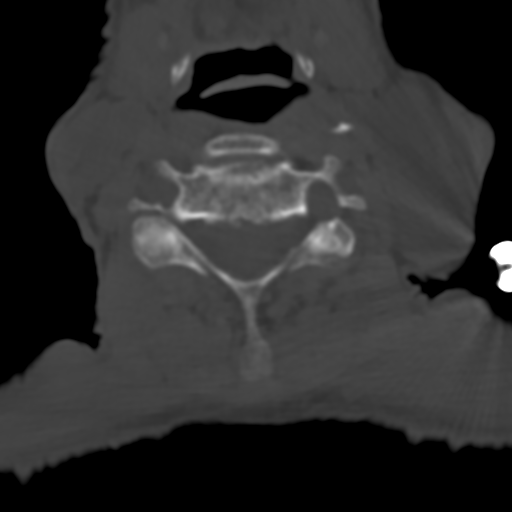
[im 50/75  bone]
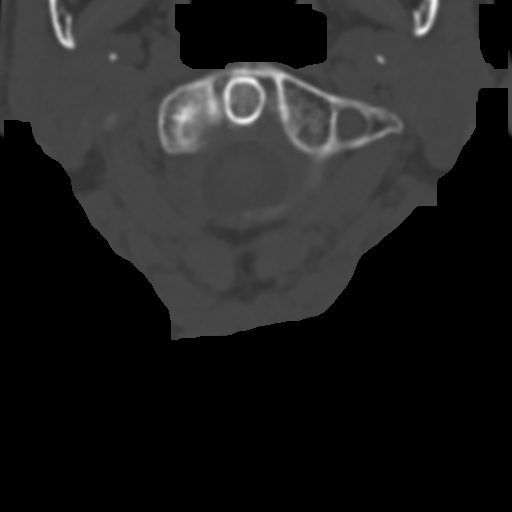

[Series 7: coronals · coronal · 0.18mm/px · 3 of 48 slices shown]
[im 10/48  bone]
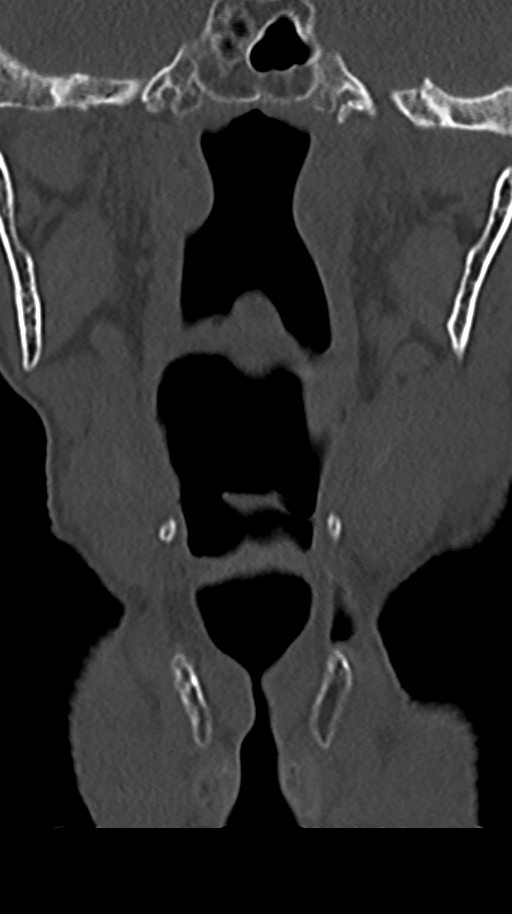
[im 19/48  bone]
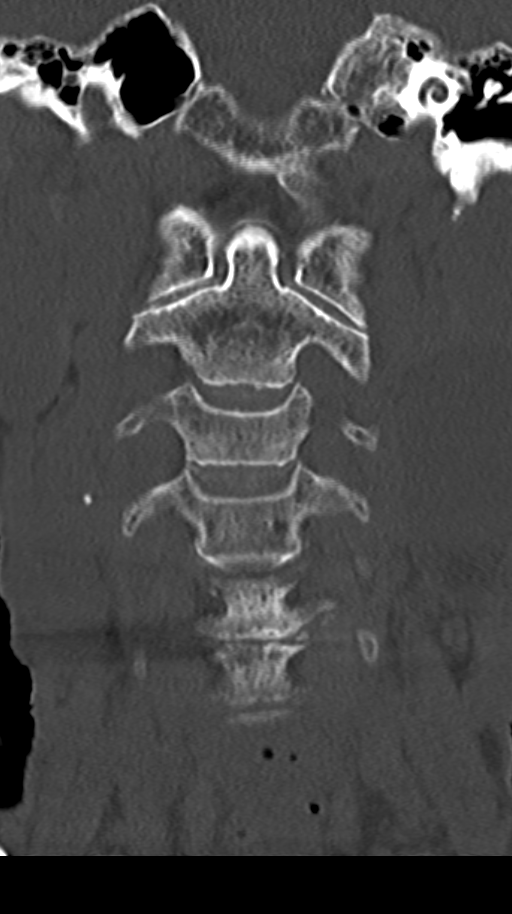
[im 29/48  bone]
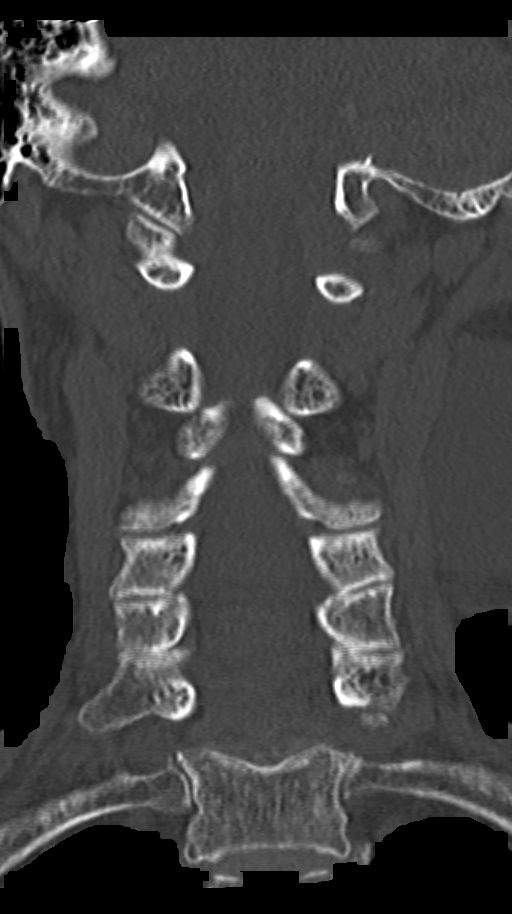

[Series 8: sagittals · sagittal · 0.21mm/px · 5 of 47 slices shown, 6 images]
[im 16/47  bone]
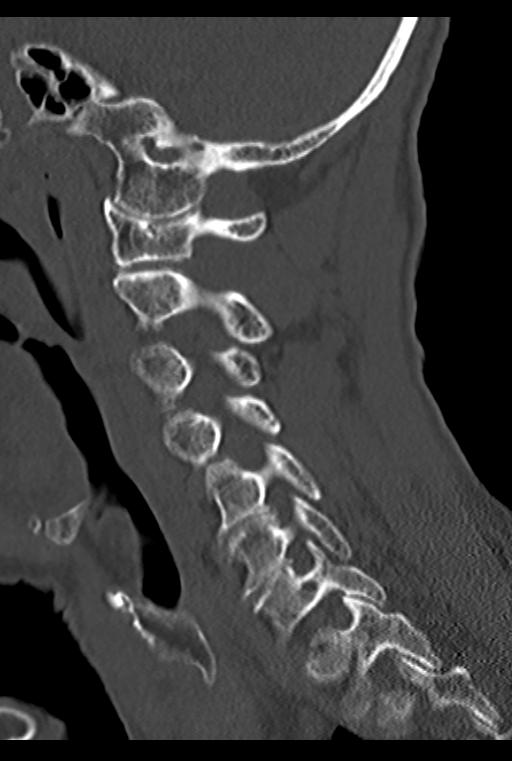
[im 20/47  bone]
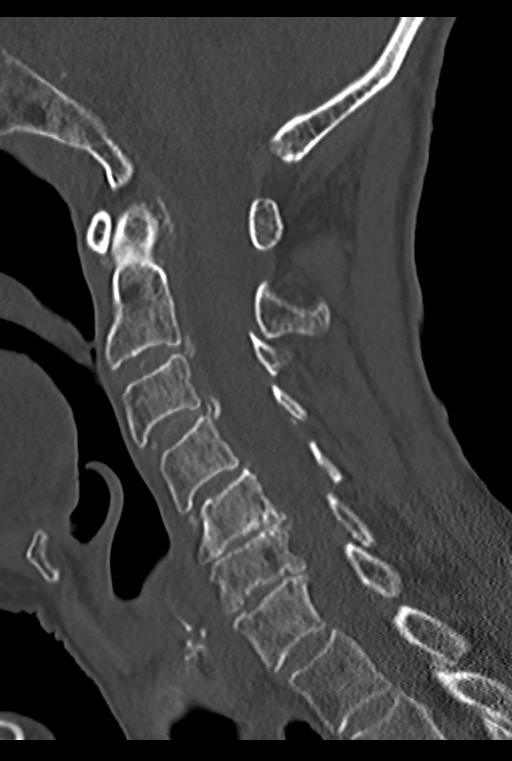
[im 24/47  soft-tissue]
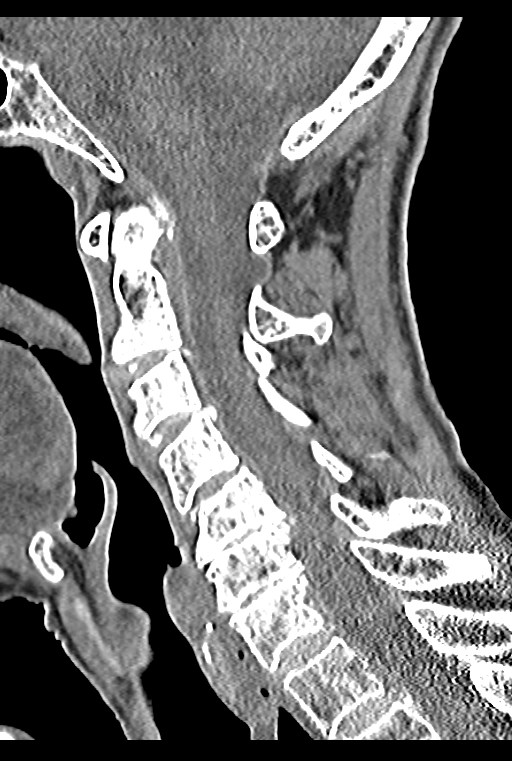
[im 24/47  bone]
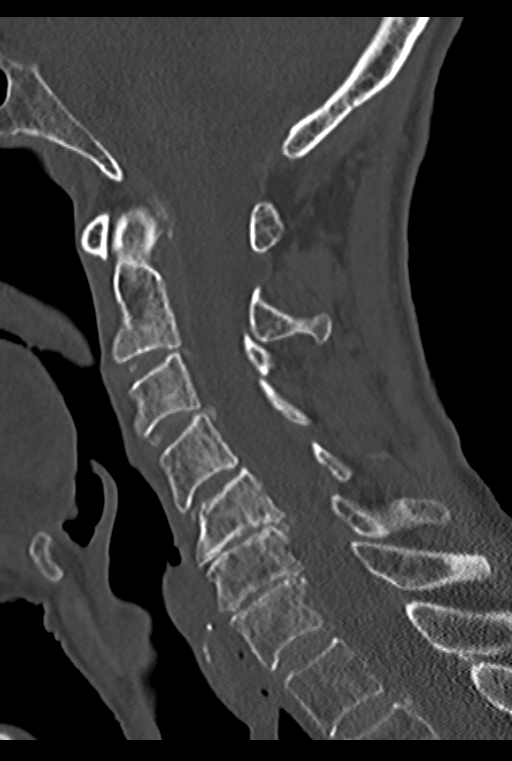
[im 27/47  bone]
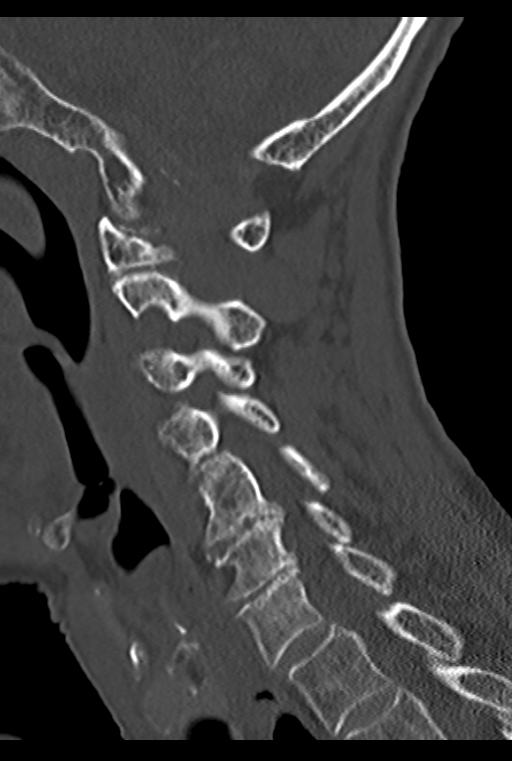
[im 31/47  bone]
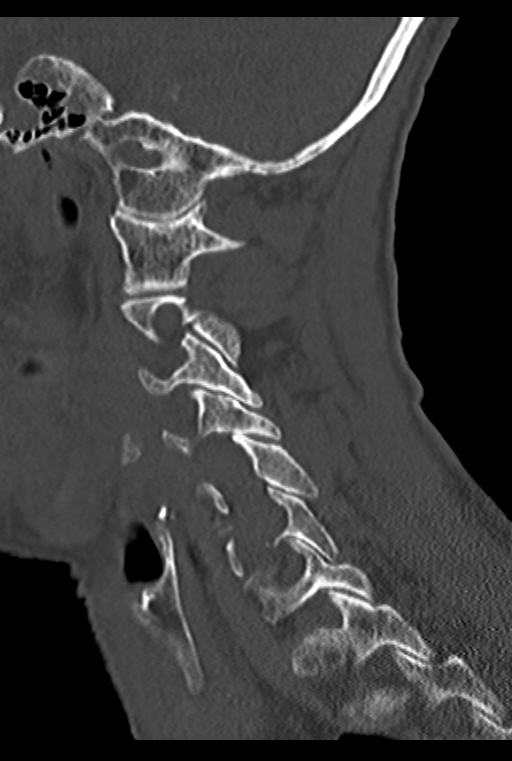

[Series 9: orthogonals · axial · 0.18mm/px · z∈[-214,-168]mm · 2 of 73 slices shown]
[im 25/73  bone]
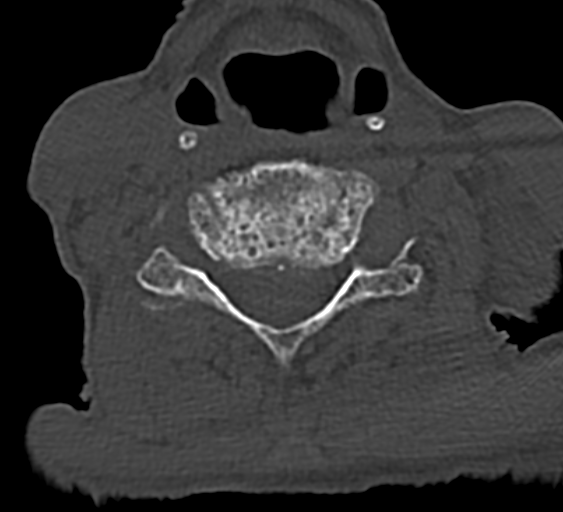
[im 49/73  bone]
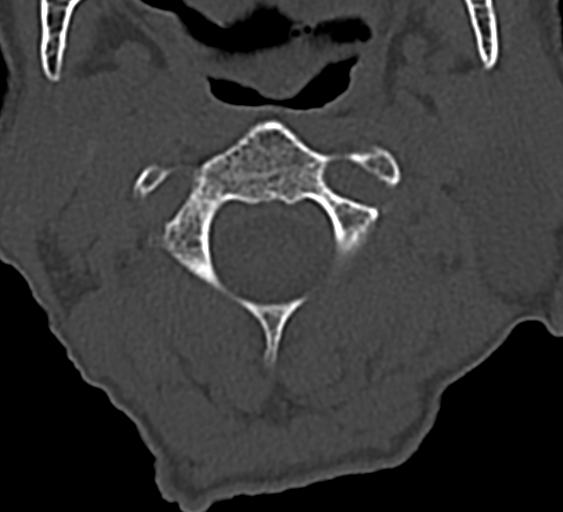

[12 of 33 positions shown; findings below may reference images not displayed]

FINDINGS: CT HEAD FINDINGS

Skull and Sinuses:Negative for fracture or destructive process. The
mastoids, middle ears, and imaged paranasal sinuses are clear.

Orbits: No acute abnormality.

Brain: No evidence of acute infarction, hemorrhage, hydrocephalus,
or mass lesion/mass effect. Advanced generalized brain atrophy which
has mildly progressed from 6445. There is chronic small vessel
disease with ischemic gliosis confluent throughout the deep cerebral
white matter.

CT CERVICAL SPINE FINDINGS

Negative for acute fracture or subluxation. Cervical spine alignment
is stable from 0703 neck CT. No prevertebral edema. No gross
cervical canal hematoma. Diffuse degenerative disc disease, with
disc narrowing maximal at C5-6 and C6-7. No significant osseous
canal or foraminal stenosis.

A cystic mass in the left neck, deep to the sternocleidomastoid is
essentially stable at 5 x 2.3 cm maximal axial dimension. Given
relative stability, this is presumably a benign cyst.
IMPRESSION: 1. No evidence of intracranial or cervical spine injury.
2. Brain atrophy and chronic small vessel disease.
3. 5 cm cystic mass in the left neck, benign given relative
stability since [DATE].
